# Patient Record
Sex: Female | Born: 1955
Health system: Southern US, Community
[De-identification: ages and names within clinical notes are randomized; demographics above are authoritative.]

## PROBLEM LIST (undated history)

## (undated) DIAGNOSIS — M545 Low back pain, unspecified: Secondary | ICD-10-CM

## (undated) DIAGNOSIS — C4491 Basal cell carcinoma of skin, unspecified: Secondary | ICD-10-CM

## (undated) DIAGNOSIS — I10 Essential (primary) hypertension: Secondary | ICD-10-CM

## (undated) DIAGNOSIS — E785 Hyperlipidemia, unspecified: Secondary | ICD-10-CM

## (undated) HISTORY — PX: TONSILLECTOMY: SUR1361

## (undated) HISTORY — DX: Low back pain: M54.5

## (undated) HISTORY — DX: Basal cell carcinoma of skin, unspecified: C44.91

## (undated) HISTORY — DX: Low back pain, unspecified: M54.50

## (undated) HISTORY — DX: Hyperlipidemia, unspecified: E78.5

---

## 1998-12-08 ENCOUNTER — Other Ambulatory Visit: Admission: RE | Admit: 1998-12-08 | Discharge: 1998-12-08 | Payer: Self-pay | Admitting: *Deleted

## 2008-08-22 HISTORY — PX: COLONOSCOPY: SHX174

## 2008-11-18 ENCOUNTER — Ambulatory Visit: Payer: Self-pay | Admitting: Family Medicine

## 2008-11-18 DIAGNOSIS — I10 Essential (primary) hypertension: Secondary | ICD-10-CM | POA: Insufficient documentation

## 2008-11-18 DIAGNOSIS — R143 Flatulence: Secondary | ICD-10-CM

## 2008-11-18 DIAGNOSIS — R141 Gas pain: Secondary | ICD-10-CM | POA: Insufficient documentation

## 2008-11-18 DIAGNOSIS — R142 Eructation: Secondary | ICD-10-CM

## 2008-11-18 HISTORY — DX: Gas pain: R14.1

## 2008-11-18 HISTORY — DX: Gas pain: R14.3

## 2009-01-20 ENCOUNTER — Emergency Department (HOSPITAL_COMMUNITY): Admission: EM | Admit: 2009-01-20 | Discharge: 2009-01-20 | Payer: Self-pay | Admitting: Family Medicine

## 2009-04-08 ENCOUNTER — Ambulatory Visit: Payer: Self-pay | Admitting: Family Medicine

## 2009-04-08 LAB — CONVERTED CEMR LAB
ALT: 13 units/L (ref 0–35)
AST: 16 units/L (ref 0–37)
Alkaline Phosphatase: 62 units/L (ref 39–117)
Basophils Absolute: 0 10*3/uL (ref 0.0–0.1)
Basophils Relative: 0.4 % (ref 0.0–3.0)
Bilirubin, Direct: 0.1 mg/dL (ref 0.0–0.3)
CO2: 31 meq/L (ref 19–32)
Chloride: 109 meq/L (ref 96–112)
Creatinine, Ser: 0.7 mg/dL (ref 0.4–1.2)
Eosinophils Absolute: 0.1 10*3/uL (ref 0.0–0.7)
LDL Cholesterol: 140 mg/dL — ABNORMAL HIGH (ref 0–99)
MCHC: 33.8 g/dL (ref 30.0–36.0)
MCV: 92.7 fL (ref 78.0–100.0)
Monocytes Absolute: 0.5 10*3/uL (ref 0.1–1.0)
Neutro Abs: 4.5 10*3/uL (ref 1.4–7.7)
Neutrophils Relative %: 66.6 % (ref 43.0–77.0)
Potassium: 4.5 meq/L (ref 3.5–5.1)
RBC: 4.1 M/uL (ref 3.87–5.11)
RDW: 12.1 % (ref 11.5–14.6)
Sodium: 144 meq/L (ref 135–145)
Total Bilirubin: 0.7 mg/dL (ref 0.3–1.2)
Total CHOL/HDL Ratio: 4
Total Protein: 7 g/dL (ref 6.0–8.3)

## 2009-04-15 ENCOUNTER — Other Ambulatory Visit: Admission: RE | Admit: 2009-04-15 | Discharge: 2009-04-15 | Payer: Self-pay | Admitting: Family Medicine

## 2009-04-15 ENCOUNTER — Encounter: Payer: Self-pay | Admitting: Family Medicine

## 2009-04-15 ENCOUNTER — Ambulatory Visit: Payer: Self-pay | Admitting: Family Medicine

## 2009-04-15 DIAGNOSIS — E7800 Pure hypercholesterolemia, unspecified: Secondary | ICD-10-CM | POA: Insufficient documentation

## 2009-04-22 ENCOUNTER — Encounter (INDEPENDENT_AMBULATORY_CARE_PROVIDER_SITE_OTHER): Payer: Self-pay | Admitting: *Deleted

## 2009-05-08 ENCOUNTER — Ambulatory Visit: Payer: Self-pay | Admitting: Gastroenterology

## 2009-05-19 ENCOUNTER — Encounter: Admission: RE | Admit: 2009-05-19 | Discharge: 2009-05-19 | Payer: Self-pay | Admitting: Family Medicine

## 2009-05-20 ENCOUNTER — Encounter (INDEPENDENT_AMBULATORY_CARE_PROVIDER_SITE_OTHER): Payer: Self-pay | Admitting: *Deleted

## 2009-05-22 ENCOUNTER — Encounter: Payer: Self-pay | Admitting: Gastroenterology

## 2009-05-22 ENCOUNTER — Ambulatory Visit: Payer: Self-pay | Admitting: Gastroenterology

## 2009-05-26 ENCOUNTER — Encounter: Payer: Self-pay | Admitting: Gastroenterology

## 2009-07-21 ENCOUNTER — Ambulatory Visit: Payer: Self-pay | Admitting: Family Medicine

## 2009-07-22 LAB — CONVERTED CEMR LAB
AST: 22 units/L (ref 0–37)
Cholesterol: 200 mg/dL (ref 0–200)
LDL Cholesterol: 130 mg/dL — ABNORMAL HIGH (ref 0–99)
Total CHOL/HDL Ratio: 4

## 2009-09-18 ENCOUNTER — Emergency Department (HOSPITAL_COMMUNITY): Admission: EM | Admit: 2009-09-18 | Discharge: 2009-09-18 | Payer: Self-pay | Admitting: Family Medicine

## 2010-03-16 ENCOUNTER — Emergency Department (HOSPITAL_COMMUNITY): Admission: EM | Admit: 2010-03-16 | Discharge: 2010-03-16 | Payer: Self-pay | Admitting: Family Medicine

## 2010-11-06 LAB — POCT URINALYSIS DIP (DEVICE)
Bilirubin Urine: NEGATIVE
Glucose, UA: NEGATIVE mg/dL
Nitrite: NEGATIVE
Urobilinogen, UA: 0.2 mg/dL (ref 0.0–1.0)

## 2011-06-20 ENCOUNTER — Other Ambulatory Visit: Payer: Self-pay | Admitting: Family Medicine

## 2011-06-20 NOTE — Telephone Encounter (Signed)
Numbers we have do not work so i notified pharmacy to let her know

## 2011-06-20 NOTE — Telephone Encounter (Signed)
Overdue for CPX .. last seen 2010.Marland Kitchen Needs appt okay to refill till then if cshe has been on consistently prior to this. If she has been off for a while get her in soon before refiulling.

## 2011-07-24 ENCOUNTER — Emergency Department (HOSPITAL_COMMUNITY): Payer: Self-pay

## 2011-07-24 ENCOUNTER — Emergency Department (HOSPITAL_COMMUNITY)
Admission: EM | Admit: 2011-07-24 | Discharge: 2011-07-24 | Disposition: A | Discharge status: Home / Self Care | Payer: Self-pay | Attending: Emergency Medicine | Admitting: Emergency Medicine

## 2011-07-24 ENCOUNTER — Encounter: Payer: Self-pay | Admitting: *Deleted

## 2011-07-24 DIAGNOSIS — Y9239 Other specified sports and athletic area as the place of occurrence of the external cause: Secondary | ICD-10-CM | POA: Insufficient documentation

## 2011-07-24 DIAGNOSIS — I10 Essential (primary) hypertension: Secondary | ICD-10-CM | POA: Insufficient documentation

## 2011-07-24 DIAGNOSIS — R296 Repeated falls: Secondary | ICD-10-CM | POA: Insufficient documentation

## 2011-07-24 DIAGNOSIS — S52131A Displaced fracture of neck of right radius, initial encounter for closed fracture: Secondary | ICD-10-CM

## 2011-07-24 DIAGNOSIS — S52133A Displaced fracture of neck of unspecified radius, initial encounter for closed fracture: Secondary | ICD-10-CM | POA: Insufficient documentation

## 2011-07-24 HISTORY — DX: Essential (primary) hypertension: I10

## 2011-07-24 MED ORDER — HYDROCODONE-ACETAMINOPHEN 5-325 MG PO TABS: ORAL_TABLET | ORAL | Status: DC

## 2011-07-24 MED ORDER — HYDROCODONE-ACETAMINOPHEN 5-325 MG PO TABS
1.0000 | ORAL_TABLET | Freq: Once | ORAL | Status: AC
  Administered 2011-07-24: 1 via ORAL
  Filled 2011-07-24: qty 1

## 2011-07-24 NOTE — ED Provider Notes (Signed)
History     CSN: [id number] Arrival date & time: 07/24/2011  6:41 PM   First MD Initiated Contact with Patient 07/24/11 1805      Chief Complaint  Patient presents with  . Arm Injury    right     (Consider location/radiation/quality/duration/timing/severity/associated sxs/prior treatment) Patient is a 55 y.o. female presenting with arm injury. The history is provided by the patient. No language interpreter was used.  Arm Injury  The incident occurred just prior to arrival. The incident occurred at home. The injury mechanism was a fall. Context: pt was walking down a hill and caught her weight on outstretched arm.  c/o R elbow pain. The wounds were not self-inflicted. She came to the ER via personal transport. The pain is moderate. There have been no prior injuries to these areas. She is right-handed. She has been behaving normally. She has received no recent medical care.    Past Medical History  Diagnosis Date  . Hypertension     History reviewed. No pertinent past surgical history.  History reviewed. No pertinent family history.  History  Substance Use Topics  . Smoking status: Never Smoker   . Smokeless tobacco: Not on file  . Alcohol Use: No    OB History    Grav Para Term Preterm Abortions TAB SAB Ect Mult Living                  Review of Systems  Musculoskeletal:       Trauma  All other systems reviewed and are negative.    Allergies  Review of patient's allergies indicates no known allergies.  Home Medications   Current Outpatient Rx  Name Route Sig Dispense Refill  . AMLODIPINE BESYLATE 5 MG PO TABS Oral Take 5 mg by mouth daily.      . IBUPROFEN 200 MG PO TABS Oral Take 200 mg by mouth as needed. For pain     . LISINOPRIL 40 MG PO TABS Oral Take 40 mg by mouth daily.        BP 142/88  Pulse 79  Temp(Src) 98.3 F (36.8 C) (Oral)  Resp 18  Ht 5\' 3"  (1.6 m)  Wt 159 lb (72.122 kg)  BMI 28.17 kg/m2  SpO2 100%  Physical Exam  Nursing note  and vitals reviewed. Constitutional: She is oriented to person, place, and time. She appears well-developed and well-nourished. No distress.  HENT:  Head: Normocephalic and atraumatic.  Eyes: EOM are normal.  Neck: Normal range of motion.  Cardiovascular: Normal rate, regular rhythm and normal heart sounds.   Pulmonary/Chest: Effort normal and breath sounds normal.  Abdominal: Soft. She exhibits no distension. There is no tenderness.  Musculoskeletal: She exhibits tenderness.       Right elbow: She exhibits decreased range of motion, swelling and effusion. She exhibits no deformity and no laceration. tenderness found. Radial head tenderness noted.       Arms: Neurological: She is alert and oriented to person, place, and time.  Skin: Skin is warm and dry.  Psychiatric: She has a normal mood and affect. Judgment normal.    ED Course  Procedures (including critical care time)  Labs Reviewed - No data to display Dg Elbow Complete Right  07/24/2011  *RADIOLOGY REPORT*  Clinical Data: Fall, right elbow pain.  RIGHT ELBOW - COMPLETE 3+ VIEW  Comparison: None  Findings: There is a right radial neck fracture.  This is a subtle, seen best on the oblique view.  Associated large  joint effusion. No additional acute bony abnormality.  IMPRESSION: Subtle right radial neck fracture with associated joint effusion.  Original Report Authenticated By: Cyndie Chime, M.D.   Dg Forearm Right  07/24/2011  *RADIOLOGY REPORT*  Clinical Data: Fall, pain.  RIGHT FOREARM - 2 VIEW  Comparison: None  Findings: No acute bony abnormality within the forearm or wrist. The elbow is not as well visualized on this forearm series.  Please see elbow report.  IMPRESSION: No acute forearm abnormality.  Original Report Authenticated By: Cyndie Chime, M.D.     No diagnosis found.    MDM   8:22 PM i spoke with dr. Hilda Lias.  He is coming to see the pt.       Worthy Rancher, PA 07/24/11 2015  Worthy Rancher,  PA 07/24/11 [phone]

## 2011-07-24 NOTE — Consult Note (Signed)
Reason for Consult:radial head fracture right  Referring Physician: ER    Valerie Parker is an 55 y.o. female.  HPI: [place] grandkids.  She fell on out stretched hand and hurt her right elbow.  Xrays show a nondisplaced fracture of the radial head on the right.  She has no other injury.  Past Medical History  Diagnosis Date  . Hypertension     History reviewed. No pertinent past surgical history.  History reviewed. No pertinent family history.  Social History:  reports that she has never smoked. She does not have any smokeless tobacco history on file. She reports that she does not drink alcohol or use illicit drugs.  Allergies: No Known Allergies  Medications: I have reviewed the patient's current medications.  No results found for this or any previous visit (from the past 48 hour(s)).  Dg Elbow Complete Right  07/24/2011  *RADIOLOGY REPORT*  Clinical Data: Fall, right elbow pain.  RIGHT ELBOW - COMPLETE 3+ VIEW  Comparison: None  Findings: There is a right radial neck fracture.  This is a subtle, seen best on the oblique view.  Associated large joint effusion. No additional acute bony abnormality.  IMPRESSION: Subtle right radial neck fracture with associated joint effusion.  Original Report Authenticated By: Cyndie Chime, M.D.   Dg Forearm Right  07/24/2011  *RADIOLOGY REPORT*  Clinical Data: Fall, pain.  RIGHT FOREARM - 2 VIEW  Comparison: None  Findings: No acute bony abnormality within the forearm or wrist. The elbow is not as well visualized on this forearm series.  Please see elbow report.  IMPRESSION: No acute forearm abnormality.  Original Report Authenticated By: Cyndie Chime, M.D.    Review of Systems  Constitutional: Negative.   HENT: Negative.   Respiratory: Negative.   Cardiovascular: Negative.   Gastrointestinal: Negative.   Genitourinary: Negative.   Musculoskeletal: Positive for joint pain (right elbow pain today after fall.   Pain to supinate.) and falls.  Skin: Negative.   Neurological: Negative.   Endo/Heme/Allergies: Negative.   Psychiatric/Behavioral: Negative.    Blood pressure 142/88, pulse 79, temperature 98.3 F (36.8 C), temperature source Oral, resp. rate 18, height 5\' 3"  (1.6 m), weight 72.122 kg (159 lb), SpO2 100.00%. Physical Exam  Constitutional: She is oriented to person, place, and time. She appears well-developed and well-nourished.  HENT:  Head: Normocephalic and atraumatic.  Eyes: Conjunctivae and EOM are normal. Pupils are equal, round, and reactive to light.  Neck: Normal range of motion. Neck supple.  Cardiovascular: Regular rhythm and intact distal pulses.   Respiratory: Effort normal and breath sounds normal.  GI: Soft. Bowel sounds are normal.  Musculoskeletal: She exhibits tenderness (with motion of the right elbow, pain with supination/pronation.  No redness or swelling.).       Right elbow: She exhibits decreased range of motion.       Arms: Neurological: She is alert and oriented to person, place, and time. She has normal reflexes.  Skin: Skin is warm.  Psychiatric: She has a normal mood and affect. Her behavior is normal. Judgment and thought content normal.    Assessment/Plan: Nondisplaced fracture of the right radial head.  She will be placed in a posterior splint.  I will see her in ten days in the office and remove the splint.    Precautions discussed.  Larson Limones 07/24/2011, 8:28 PM

## 2011-07-24 NOTE — ED Notes (Signed)
Pt c/o pain to right arm after falling today

## 2011-07-24 NOTE — ED Notes (Signed)
Pt a/ox4. resp even and unlabored. NAD at this time. D/c Instructions and rx x1 reviewed with pt. Pt verbalized understanding. Pt ambulated to lobby with steady gate.

## 2011-07-24 NOTE — ED Notes (Signed)
Posterior splint applied. Sling applied.

## 2011-07-25 NOTE — ED Provider Notes (Signed)
Medical screening examination/treatment/procedure(s) were performed by non-physician practitioner and as supervising physician I was immediately available for consultation/collaboration.   Joya Gaskins, MD 07/25/11 1320

## 2011-08-02 ENCOUNTER — Other Ambulatory Visit (HOSPITAL_COMMUNITY): Payer: Self-pay | Admitting: Orthopaedic Surgery

## 2011-08-02 ENCOUNTER — Ambulatory Visit (HOSPITAL_COMMUNITY)
Admission: RE | Admit: 2011-08-02 | Discharge: 2011-08-02 | Disposition: A | Discharge status: Home / Self Care | Payer: Self-pay | Source: Ambulatory Visit | Attending: Orthopaedic Surgery | Admitting: Orthopaedic Surgery

## 2011-08-02 DIAGNOSIS — S52123A Displaced fracture of head of unspecified radius, initial encounter for closed fracture: Secondary | ICD-10-CM

## 2011-08-02 DIAGNOSIS — Z4789 Encounter for other orthopedic aftercare: Secondary | ICD-10-CM | POA: Insufficient documentation

## 2011-08-25 ENCOUNTER — Ambulatory Visit (HOSPITAL_COMMUNITY)
Admission: RE | Admit: 2011-08-25 | Discharge: 2011-08-25 | Disposition: A | Discharge status: Home / Self Care | Payer: 59 | Source: Ambulatory Visit | Attending: Orthopaedic Surgery | Admitting: Orthopaedic Surgery

## 2011-08-25 ENCOUNTER — Other Ambulatory Visit (HOSPITAL_COMMUNITY): Payer: Self-pay | Admitting: Orthopaedic Surgery

## 2011-08-25 DIAGNOSIS — T148XXA Other injury of unspecified body region, initial encounter: Secondary | ICD-10-CM

## 2011-08-25 DIAGNOSIS — IMO0001 Reserved for inherently not codable concepts without codable children: Secondary | ICD-10-CM | POA: Insufficient documentation

## 2012-06-06 ENCOUNTER — Other Ambulatory Visit: Payer: Self-pay | Admitting: Family Medicine

## 2012-06-06 DIAGNOSIS — Z139 Encounter for screening, unspecified: Secondary | ICD-10-CM

## 2012-06-14 ENCOUNTER — Ambulatory Visit (HOSPITAL_COMMUNITY)
Admission: RE | Admit: 2012-06-14 | Discharge: 2012-06-14 | Disposition: A | Discharge status: Home / Self Care | Payer: 59 | Source: Ambulatory Visit | Attending: Family Medicine | Admitting: Family Medicine

## 2012-06-14 DIAGNOSIS — Z139 Encounter for screening, unspecified: Secondary | ICD-10-CM

## 2012-06-14 DIAGNOSIS — Z1231 Encounter for screening mammogram for malignant neoplasm of breast: Secondary | ICD-10-CM | POA: Insufficient documentation

## 2012-11-13 ENCOUNTER — Ambulatory Visit (INDEPENDENT_AMBULATORY_CARE_PROVIDER_SITE_OTHER): Payer: 59 | Admitting: Family Medicine

## 2012-11-13 ENCOUNTER — Encounter: Payer: Self-pay | Admitting: Family Medicine

## 2012-11-13 VITALS — BP 146/94 | Temp 98.3°F | Ht 62.0 in | Wt 156.4 lb

## 2012-11-13 DIAGNOSIS — B351 Tinea unguium: Secondary | ICD-10-CM

## 2012-11-13 DIAGNOSIS — I10 Essential (primary) hypertension: Secondary | ICD-10-CM

## 2012-11-13 MED ORDER — LISINOPRIL 40 MG PO TABS: 40.0000 mg | ORAL_TABLET | Freq: Every day | ORAL | Status: DC

## 2012-11-13 MED ORDER — AMLODIPINE BESYLATE 5 MG PO TABS: 5.0000 mg | ORAL_TABLET | Freq: Every day | ORAL | Status: DC

## 2012-11-13 MED ORDER — CEFPROZIL 500 MG PO TABS: 500.0000 mg | ORAL_TABLET | Freq: Two times a day (BID) | ORAL | Status: DC

## 2012-11-13 NOTE — Patient Instructions (Signed)
We will work on podiatry referral

## 2012-11-14 NOTE — Progress Notes (Signed)
  Subjective:    Patient ID: Valerie Parker, female    DOB: 1956-03-16, 57 y.o.   MRN: [id number]  HPI patient presents the office complaining of toe pain. Off-and-on for the past year. Has had thickening of the toenails. In the last week has been much more tender. Possibly even some discharge. No fever. Claims compliance with her blood pressure medication. No headache or chest pain.    Review of Systems ROS otherwise negative.     Objective:   Physical Exam  Vital signs stable blood pressure good on repeat HEENT normal. Lungs clear. Heart regular in rhythm. Feet dystrophic toes with significant onychomycosis      Assessment & Plan:  Impression onychomycosis with possible secondary cellulitis. Discussed. #2 hypertension good control. Plan Cefzil twice a day 10 days. Podiatry consult. Maintain same blood pressure meds. Recheck in 6 months. WSL

## 2012-11-19 ENCOUNTER — Telehealth: Payer: Self-pay | Admitting: Family Medicine

## 2012-11-19 NOTE — Telephone Encounter (Signed)
Faxed demo, ins crd, OV note to Dr. Pricilla Holm @ [phone] for appt on 12/20/12 @ 8:45, pt's aware

## 2013-05-18 ENCOUNTER — Encounter: Payer: Self-pay | Admitting: *Deleted

## 2013-05-20 ENCOUNTER — Ambulatory Visit: Payer: 59 | Admitting: Family Medicine

## 2013-05-24 ENCOUNTER — Encounter: Payer: Self-pay | Admitting: Family Medicine

## 2013-05-24 ENCOUNTER — Ambulatory Visit (INDEPENDENT_AMBULATORY_CARE_PROVIDER_SITE_OTHER): Payer: 59 | Admitting: Family Medicine

## 2013-05-24 VITALS — BP 128/90 | Temp 97.8°F | Ht 63.5 in | Wt 158.0 lb

## 2013-05-24 DIAGNOSIS — E7800 Pure hypercholesterolemia, unspecified: Secondary | ICD-10-CM

## 2013-05-24 DIAGNOSIS — C4491 Basal cell carcinoma of skin, unspecified: Secondary | ICD-10-CM

## 2013-05-24 DIAGNOSIS — E785 Hyperlipidemia, unspecified: Secondary | ICD-10-CM

## 2013-05-24 DIAGNOSIS — Z79899 Other long term (current) drug therapy: Secondary | ICD-10-CM

## 2013-05-24 DIAGNOSIS — I10 Essential (primary) hypertension: Secondary | ICD-10-CM

## 2013-05-24 MED ORDER — AMLODIPINE BESYLATE 5 MG PO TABS: 5.0000 mg | ORAL_TABLET | Freq: Every day | ORAL | Status: DC

## 2013-05-24 MED ORDER — LISINOPRIL 40 MG PO TABS: 40.0000 mg | ORAL_TABLET | Freq: Every day | ORAL | Status: DC

## 2013-05-24 NOTE — Progress Notes (Signed)
  Subjective:    Patient ID: Valerie Parker, female    DOB: 01-Oct-1955, 57 y.o.   MRN: [id number]  HPIscab on forehead. Not painful. Patient scratched off scab and brought it in today. Patient has history of skin cancer on her chin. This was basal cell skin cancer. Check moles on left arm.   Patient claims compliance with blood pressure medicine. Blood pressure decent elsewhere. Tried watch her salt intake. Exercise not much these days.  History of hyperlipidemia. Patient claims that she is noncompliant with medications.  Review of Systems No chest pain no shortness breath no abdominal pain ROS woman    Objective:   Physical Exam  Alert no acute distress. Lungs clear. Heart regular in rhythm. H&T for head hypertrophic small lesion with central talented ectasia and pearly edges. Arm seborrheic keratoses diffuse. Blood pressure decent on repeat.      Assessment & Plan:  Impression 1 hypertension good control. #2 hyperlipidemia status uncertain. #3 seborrheic keratosis of the arms reassured #4 probable basal cell skin cancer for head discussed plan term consult. Appropriate blood work. Meds refilled. Diet exercise discussed. WSL

## 2013-06-07 LAB — HEPATIC FUNCTION PANEL
ALT: 21 U/L (ref 0–35)
AST: 15 U/L (ref 0–37)
Alkaline Phosphatase: 69 U/L (ref 39–117)
Indirect Bilirubin: 0.5 mg/dL (ref 0.0–0.9)
Total Protein: 7 g/dL (ref 6.0–8.3)

## 2013-06-07 LAB — BASIC METABOLIC PANEL
BUN: 15 mg/dL (ref 6–23)
Calcium: 9.3 mg/dL (ref 8.4–10.5)
Creat: 0.71 mg/dL (ref 0.50–1.10)

## 2013-06-07 LAB — LIPID PANEL
Cholesterol: 191 mg/dL (ref 0–200)
Triglycerides: 88 mg/dL (ref <150)

## 2013-06-17 ENCOUNTER — Encounter: Payer: Self-pay | Admitting: Family Medicine

## 2013-09-05 ENCOUNTER — Ambulatory Visit (INDEPENDENT_AMBULATORY_CARE_PROVIDER_SITE_OTHER): Payer: 59 | Admitting: Nurse Practitioner

## 2013-09-05 ENCOUNTER — Encounter: Payer: Self-pay | Admitting: Nurse Practitioner

## 2013-09-05 VITALS — BP 128/88 | Temp 98.7°F | Ht 63.5 in | Wt 161.0 lb

## 2013-09-05 DIAGNOSIS — Z1239 Encounter for other screening for malignant neoplasm of breast: Secondary | ICD-10-CM

## 2013-09-05 DIAGNOSIS — N390 Urinary tract infection, site not specified: Secondary | ICD-10-CM

## 2013-09-05 DIAGNOSIS — R300 Dysuria: Secondary | ICD-10-CM

## 2013-09-05 LAB — POCT URINALYSIS DIPSTICK
PH UA: 5
RBC UA: 250
SPEC GRAV UA: 1.025
UROBILINOGEN UA: 2

## 2013-09-05 MED ORDER — CIPROFLOXACIN HCL 500 MG PO TABS: 500.0000 mg | ORAL_TABLET | Freq: Two times a day (BID) | ORAL | Status: DC

## 2013-09-05 NOTE — Patient Instructions (Signed)
AZO standard as directed for 48 hours then stop 

## 2013-09-06 ENCOUNTER — Encounter: Payer: Self-pay | Admitting: Nurse Practitioner

## 2013-09-06 LAB — POCT UA - MICROSCOPIC ONLY: BACTERIA, U MICROSCOPIC: POSITIVE

## 2013-09-06 NOTE — Progress Notes (Signed)
Subjective:  Presents complaints of urinary symptoms over the past week. Dysuria with urgency and frequency. No history of recent UTI. No fever or chills. No nausea vomiting. No pelvic pain. No back or flank pain. No vaginal discharge. Same sexual partner. Taking fluids well. Patient requesting mammogram and information on colonoscopy. Is also due for her preventive health physical. Her insurance will run out at the end of January, it may be months before she will have insurance again.  Objective:   BP 128/88  Temp(Src) 98.7 F (37.1 C) (Oral)  Ht 5' 3.5" (1.613 m)  Wt 161 lb (73.029 kg)  BMI 28.07 kg/m2 NAD. Alert, oriented. Lungs clear. No CVA or flank tenderness. Heart regular rhythm. Abdomen soft nondistended with minimal suprapubic area discomfort. Urine microscopic WBC TNTC; RBCs TNTC with bacteria noted.  Assessment:Infection of urinary tract  Dysuria - Plan: POCT UA - Microscopic Only, POCT urinalysis dipstick, MM Digital Screening  Screening for breast cancer - Plan: MM Digital Screening  Plan: Meds ordered this encounter  Medications  . ciprofloxacin (CIPRO) 500 MG tablet    Sig: Take 1 tablet (500 mg total) by mouth 2 (two) times daily.    Dispense:  14 tablet    Refill:  0    Order Specific Question:  Supervising Provider    Answer:  Maggie Font   May use AZO for the next 48 hours as directed then DC. Warning signs reviewed. Call back in 4 days if no improvement, call or go to ER sooner if worse. Mammogram scheduled. Given information on colonoscopy see the patient can schedule her appointment. Strongly recommend preventive health physical within the next few weeks.

## 2013-09-09 ENCOUNTER — Ambulatory Visit (HOSPITAL_COMMUNITY)
Admission: RE | Admit: 2013-09-09 | Discharge: 2013-09-09 | Disposition: A | Discharge status: Home / Self Care | Payer: 59 | Source: Ambulatory Visit | Attending: Nurse Practitioner | Admitting: Nurse Practitioner

## 2013-09-09 DIAGNOSIS — R300 Dysuria: Secondary | ICD-10-CM

## 2013-09-09 DIAGNOSIS — Z1239 Encounter for other screening for malignant neoplasm of breast: Secondary | ICD-10-CM

## 2013-09-09 DIAGNOSIS — Z1231 Encounter for screening mammogram for malignant neoplasm of breast: Secondary | ICD-10-CM | POA: Insufficient documentation

## 2013-09-12 ENCOUNTER — Encounter: Payer: Self-pay | Admitting: Nurse Practitioner

## 2013-09-12 ENCOUNTER — Ambulatory Visit (INDEPENDENT_AMBULATORY_CARE_PROVIDER_SITE_OTHER): Payer: 59 | Admitting: Nurse Practitioner

## 2013-09-12 VITALS — BP 128/80 | Ht 63.5 in | Wt 162.0 lb

## 2013-09-12 DIAGNOSIS — Z0000 Encounter for general adult medical examination without abnormal findings: Secondary | ICD-10-CM

## 2013-09-12 DIAGNOSIS — Z01419 Encounter for gynecological examination (general) (routine) without abnormal findings: Secondary | ICD-10-CM

## 2013-09-17 ENCOUNTER — Encounter: Payer: Self-pay | Admitting: Nurse Practitioner

## 2013-09-17 NOTE — Progress Notes (Signed)
   Subjective:    Patient ID: Valerie Parker, female    DOB: 02/26/1956, 58 y.o.   MRN: [id number]  HPI Presents for wellness checkup. Same sexual partner. No vaginal bleeding or pelvic pain. Has appt for visual exam 1/28. Regular dental care. Seen recently for UTI. Symptoms have resolved. Unsure about date for colonoscopy was told 3 years but when she called GI office they said 5 years which will put her due this October. Her insurance will be ending at the end of the month.    Review of Systems  Constitutional: Negative for activity change, appetite change and fatigue.  HENT: Negative for dental problem, ear pain, sinus pressure and sore throat.   Respiratory: Negative for cough, chest tightness, shortness of breath and wheezing.   Cardiovascular: Negative for chest pain and leg swelling.  Gastrointestinal: Negative for nausea, vomiting, abdominal pain, diarrhea, constipation and blood in stool.  Genitourinary: Negative for dysuria, urgency, frequency, vaginal bleeding, vaginal discharge, enuresis, difficulty urinating, genital sores and pelvic pain.       Objective:   Physical Exam  Vitals reviewed. Constitutional: She is oriented to person, place, and time. She appears well-developed. No distress.  HENT:  Right Ear: External ear normal.  Left Ear: External ear normal.  Mouth/Throat: Oropharynx is clear and moist.  Neck: Normal range of motion. Neck supple. No tracheal deviation present. No thyromegaly present.  Cardiovascular: Normal rate, regular rhythm and normal heart sounds.  Exam reveals no gallop.   No murmur heard. Pulmonary/Chest: Effort normal and breath sounds normal.  Abdominal: Soft. She exhibits no distension. There is no tenderness.  Genitourinary: Vagina normal and uterus normal. No vaginal discharge found.  Musculoskeletal: She exhibits no edema.  Lymphadenopathy:    She has no cervical adenopathy.  Neurological: She is alert and oriented to person, place, and time.   Skin: Skin is warm and dry. No rash noted.  Psychiatric: She has a normal mood and affect. Her behavior is normal.   Breast exam: No masses noted, axillae no adenopathy. External GU normal. Vagina no discharge. Bimanual exam normal, no obvious masses, nontender. See labs 06/07/2013        Assessment & Plan:  Well woman exam  Our referral coordinator spoke with GI specialist. They're willing to repeat colonoscopy but patient's insurance will not cover until October. Patient notified. Recommend healthy diet, regular activity, daily calcium and vitamin D supplementation. Next physical in one year.

## 2014-04-10 ENCOUNTER — Encounter: Payer: Self-pay | Admitting: Gastroenterology

## 2014-05-19 ENCOUNTER — Telehealth: Payer: Self-pay | Admitting: Nurse Practitioner

## 2014-05-19 NOTE — Telephone Encounter (Signed)
Discussed with patient

## 2014-05-19 NOTE — Telephone Encounter (Signed)
Patient wants to know if the last BW that she had with our office showed any signs of Hep C or was that ruled out?

## 2014-08-26 ENCOUNTER — Ambulatory Visit: Payer: 59 | Admitting: Family Medicine

## 2014-09-05 ENCOUNTER — Ambulatory Visit (INDEPENDENT_AMBULATORY_CARE_PROVIDER_SITE_OTHER): Payer: 59 | Admitting: Family Medicine

## 2014-09-05 ENCOUNTER — Encounter: Payer: Self-pay | Admitting: Family Medicine

## 2014-09-05 VITALS — BP 130/90 | Ht 63.5 in | Wt 154.0 lb

## 2014-09-05 DIAGNOSIS — R079 Chest pain, unspecified: Secondary | ICD-10-CM

## 2014-09-05 DIAGNOSIS — E785 Hyperlipidemia, unspecified: Secondary | ICD-10-CM

## 2014-09-05 DIAGNOSIS — I10 Essential (primary) hypertension: Secondary | ICD-10-CM

## 2014-09-05 DIAGNOSIS — R072 Precordial pain: Secondary | ICD-10-CM

## 2014-09-05 DIAGNOSIS — Z1159 Encounter for screening for other viral diseases: Secondary | ICD-10-CM

## 2014-09-05 DIAGNOSIS — Z79899 Other long term (current) drug therapy: Secondary | ICD-10-CM

## 2014-09-05 MED ORDER — LISINOPRIL 40 MG PO TABS: 40.0000 mg | ORAL_TABLET | Freq: Every day | ORAL | Status: DC

## 2014-09-05 MED ORDER — AMLODIPINE BESYLATE 5 MG PO TABS: 5.0000 mg | ORAL_TABLET | Freq: Every day | ORAL | Status: DC

## 2014-09-05 MED ORDER — RANITIDINE HCL 300 MG PO TABS: 300.0000 mg | ORAL_TABLET | Freq: Every day | ORAL | Status: DC

## 2014-09-05 NOTE — Progress Notes (Signed)
   Subjective:    Patient ID: Valerie Parker, female    DOB: 1955/10/10, 59 y.o.   MRN: [id number] Of note patient arrives for "a wellness visit". On further history she has a tremendous number of issues in need of acute attention. Wellness will be delayed. To address these.   HPI The patient comes in today for a wellness visit.  A review of their health history was completed.  A review of medications was also completed.  Any needed refills: Yes  Eating habits: Health conscious  Falls/  MVA accidents in past few months: No  Regular exercise: Working, walks dog, plays with grandchildren  Specialist pt sees on regular basis: No  Preventative health issues were discussed.   Additional concerns: Mole on back, painful (2 months) Low back pain (4 months) Indigestion, N/V, diarrhea, chest pressure ,center in the past week.  Milk intolerance  Mole new concer, looks different  Itches at times, sensitivr to pressure  Low back uncomfortable at times pulling sens, feels achey and tight. Several months ago hurt a fair amnt.sig difficulty with motion. Took otc meds with work, and notes. Back has improved still pulls at times  Does not want pelvic exam. Would like breast exam and pelvic with [facility] Sauer.   Would like to be tested for Hep C. Siblings have it. Has a sister with it.three siblings f  Exercise walking four d per wk,  Full time hours nearly  Chest discomfort off and on for six months.. Chest discomfort last for 1 total minutes slight pressure times. Walks daily almost and never has discomfort with walking. Pain is primarily in the evening time. Does appear to be worse after certain meals. Next  History of hypertension claims compliance with medication.  Nothing major, usually more at rest, not walking,    Review of Systems No abdominal pain no change in bowel habits no blood in stool no weight loss no weight gain    Objective:   Physical Exam Alert vital stable blood pressure  good on repeat HEENT normal lungs clear heart regular in rhythm. Seborrheic keratosis noted a left posterior shoulder. Abdomen benign. Ankles without edema.   EKG normal sinus rhythm no significant ST-T changes       Assessment & Plan:  Impression 1 hypertension good control #2 hepatitis C exposure curiously has 3 siblings with it. #3 seborrheic keratosis discussed and reassured #4 chest pain very atypical and never with exertion will hold off on further workup for now rationale discussed #5 potential reflux plan appropriate blood work. Hepatitis C. Follow-up with [facility] Sauer for preventive physical. Trial of ranitidine. Blood pressure medications maintain. Diet exercise discussed. WSL

## 2014-09-15 LAB — LIPID PANEL
Cholesterol: 188 mg/dL (ref 0–200)
HDL: 50 mg/dL (ref >39)
LDL CALC: 125 mg/dL — AB (ref 0–99)
Total CHOL/HDL Ratio: 3.8 Ratio
Triglycerides: 64 mg/dL (ref <150)
VLDL: 13 mg/dL (ref 0–40)

## 2014-09-15 LAB — HEPATITIS C ANTIBODY: HCV AB: NEGATIVE

## 2014-09-15 LAB — HEPATIC FUNCTION PANEL
ALT: 20 U/L (ref 0–35)
AST: 15 U/L (ref 0–37)
Albumin: 4 g/dL (ref 3.5–5.2)
Alkaline Phosphatase: 70 U/L (ref 39–117)
BILIRUBIN DIRECT: 0.1 mg/dL (ref 0.0–0.3)
BILIRUBIN INDIRECT: 0.5 mg/dL (ref 0.2–1.2)
Total Bilirubin: 0.6 mg/dL (ref 0.2–1.2)
Total Protein: 6.8 g/dL (ref 6.0–8.3)

## 2014-09-15 LAB — BASIC METABOLIC PANEL
BUN: 14 mg/dL (ref 6–23)
CHLORIDE: 107 meq/L (ref 96–112)
CO2: 28 meq/L (ref 19–32)
Calcium: 8.9 mg/dL (ref 8.4–10.5)
Creat: 0.74 mg/dL (ref 0.50–1.10)
Glucose, Bld: 106 mg/dL — ABNORMAL HIGH (ref 70–99)
Potassium: 4 mEq/L (ref 3.5–5.3)
SODIUM: 143 meq/L (ref 135–145)

## 2014-09-16 ENCOUNTER — Telehealth: Payer: Self-pay | Admitting: Family Medicine

## 2014-09-16 NOTE — Telephone Encounter (Signed)
Pt called wanting to know her lab results

## 2014-09-16 NOTE — Telephone Encounter (Signed)
See lab response

## 2014-09-17 NOTE — Telephone Encounter (Signed)
Discussed results with patient

## 2014-09-17 NOTE — Telephone Encounter (Signed)
[facility] - number not reachable. 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

## 2014-09-26 ENCOUNTER — Encounter: Payer: Self-pay | Admitting: Family Medicine

## 2014-10-22 ENCOUNTER — Encounter: Payer: Self-pay | Admitting: Gastroenterology

## 2015-02-25 ENCOUNTER — Other Ambulatory Visit: Payer: Self-pay

## 2015-02-25 DIAGNOSIS — Z1231 Encounter for screening mammogram for malignant neoplasm of breast: Secondary | ICD-10-CM

## 2015-02-27 ENCOUNTER — Encounter: Payer: Self-pay | Admitting: Gastroenterology

## 2015-03-02 ENCOUNTER — Encounter: Payer: Self-pay | Admitting: Family Medicine

## 2015-03-02 ENCOUNTER — Telehealth: Payer: Self-pay | Admitting: Family Medicine

## 2015-03-02 ENCOUNTER — Ambulatory Visit (INDEPENDENT_AMBULATORY_CARE_PROVIDER_SITE_OTHER): Payer: 59 | Admitting: Family Medicine

## 2015-03-02 VITALS — BP 146/90 | Ht 63.5 in | Wt 154.0 lb

## 2015-03-02 DIAGNOSIS — M792 Neuralgia and neuritis, unspecified: Secondary | ICD-10-CM | POA: Diagnosis not present

## 2015-03-02 MED ORDER — NAPROXEN 500 MG PO TABS: 500.0000 mg | ORAL_TABLET | Freq: Two times a day (BID) | ORAL | Status: DC

## 2015-03-02 MED ORDER — VALACYCLOVIR HCL 1 G PO TABS: 1000.0000 mg | ORAL_TABLET | Freq: Three times a day (TID) | ORAL | Status: DC

## 2015-03-02 NOTE — Telephone Encounter (Signed)
Pt had shingles about 7 years ago and pt states pain feels the same as when she had shingles. Pain and itching on one side of her back. Some redness. Started last Friday and has not eased up except when taking ibuprofen. Advised pt she would need ov. Pt transferred to front to schedule ov today.

## 2015-03-02 NOTE — Telephone Encounter (Signed)
Pt called and would like for a nurse to call her back. Pt believes she may have the beginning stages of shingles but would like to talk to the nurse before making an appt.

## 2015-03-02 NOTE — Progress Notes (Signed)
   Subjective:    Patient ID: Valerie Parker, female    DOB: [birth year], 59 y.o.   MRN: [id number]  HPI Patient has hx of shingles 7 years ago and is now having the same type of nerve pain  On back for several days but doesn't have a rash yet. Pain worse over the weekend Better today No nausea No dysuria Sensitive to the touch Took ibuprofen No triggers, had been putting in posts    Review of Systems  Constitutional: Negative for fever, chills and fatigue.  HENT: Negative for congestion.   Respiratory: Negative for cough.   Cardiovascular: Negative for chest pain.  Gastrointestinal: Negative for constipation, blood in stool and anal bleeding.  Genitourinary: Negative for dysuria, frequency and flank pain.  Skin: Negative for rash.  patient states at times she feels bloated     Objective:   Physical Exam  Lungs are clear hearts regular flanks nontender to percussion sensory tenderness on the right side flank region no rash seen Patient's abdomen did not have any masses felt     Assessment & Plan:  Possible impingement of the sensory nerve in her back. It is also possible this could be a strain and finally could be early shingles prescription for Valtrex given. To get this filled if shingles symptoms turn into blistering  Patient is to follow-up with Dr. Richardson Parker for scheduled appointment on the 29  Patient was encouraged to get regular female health checkup

## 2015-03-02 NOTE — Telephone Encounter (Signed)
[facility]

## 2015-03-05 ENCOUNTER — Telehealth: Payer: Self-pay | Admitting: Family Medicine

## 2015-03-05 MED ORDER — ACYCLOVIR 800 MG PO TABS: 800.0000 mg | ORAL_TABLET | Freq: Every day | ORAL | Status: DC

## 2015-03-05 NOTE — Telephone Encounter (Signed)
Rx sent electronically to pharmacy. Patient notified. 

## 2015-03-05 NOTE — Telephone Encounter (Signed)
Zovirax may or may not be cheaper 800 mg  Have to take five per day 800 mg 5 x per d for 7 d

## 2015-03-05 NOTE — Telephone Encounter (Signed)
Pt called stating that the rash appeared yesterday evening and went to get the medicine filled today and it was going to be a 40 dollar copay and normally she only pays 10 for her medicines. Pt wants to know if there is a cheaper option.  [facility] [employer] 

## 2015-03-05 NOTE — Telephone Encounter (Signed)
Valtrex given for shingles

## 2015-03-12 ENCOUNTER — Ambulatory Visit: Payer: 59

## 2015-03-19 ENCOUNTER — Ambulatory Visit
Admission: RE | Admit: 2015-03-19 | Discharge: 2015-03-19 | Disposition: A | Discharge status: Home / Self Care | Payer: 59 | Source: Ambulatory Visit

## 2015-03-19 DIAGNOSIS — Z1231 Encounter for screening mammogram for malignant neoplasm of breast: Secondary | ICD-10-CM

## 2015-03-20 ENCOUNTER — Ambulatory Visit (INDEPENDENT_AMBULATORY_CARE_PROVIDER_SITE_OTHER): Payer: 59 | Admitting: Family Medicine

## 2015-03-20 ENCOUNTER — Encounter: Payer: Self-pay | Admitting: Family Medicine

## 2015-03-20 VITALS — BP 130/82 | Ht 63.5 in | Wt 154.6 lb

## 2015-03-20 DIAGNOSIS — I10 Essential (primary) hypertension: Secondary | ICD-10-CM

## 2015-03-20 MED ORDER — AMLODIPINE BESYLATE 5 MG PO TABS: 5.0000 mg | ORAL_TABLET | Freq: Every day | ORAL | Status: DC

## 2015-03-20 MED ORDER — LISINOPRIL 40 MG PO TABS: 40.0000 mg | ORAL_TABLET | Freq: Every day | ORAL | Status: DC

## 2015-03-20 NOTE — Progress Notes (Signed)
   Subjective:    Patient ID: Valerie Parker, female    DOB: December 14, 1955, 59 y.o.   MRN: [id number]  Hypertension This is a chronic problem. The current episode started more than 1 year ago. Risk factors for coronary artery disease include dyslipidemia and post-menopausal state. Treatments tried: norvasc, lisinopril.    Numbers elsewhere ck'ed well  Numb in the 140s  syst other number is usually aorund   havent taken bp Med yet today  Not exrecising regulaly      Review of Systems No headache no chest pain no back pain no abdominal pain    Objective:   Physical Exam Alert vital stable H&T normal. Lungs clear. Heart regular in rhythm.       Assessment & Plan:  Impression hypertension good control discussed at length plan maintain same meds. Diet exercise discussed. Check every 6 months. Screening wellness exam with [facility] Sauer soon [facility]

## 2015-04-17 ENCOUNTER — Ambulatory Visit (INDEPENDENT_AMBULATORY_CARE_PROVIDER_SITE_OTHER): Payer: 59 | Admitting: Nurse Practitioner

## 2015-04-17 ENCOUNTER — Encounter: Payer: Self-pay | Admitting: Nurse Practitioner

## 2015-04-17 ENCOUNTER — Encounter: Payer: 59 | Admitting: Nurse Practitioner

## 2015-04-17 VITALS — BP 120/82 | Ht 63.5 in | Wt 153.0 lb

## 2015-04-17 DIAGNOSIS — Z1151 Encounter for screening for human papillomavirus (HPV): Secondary | ICD-10-CM

## 2015-04-17 DIAGNOSIS — L259 Unspecified contact dermatitis, unspecified cause: Secondary | ICD-10-CM | POA: Diagnosis not present

## 2015-04-17 DIAGNOSIS — Z124 Encounter for screening for malignant neoplasm of cervix: Secondary | ICD-10-CM

## 2015-04-17 DIAGNOSIS — Z01419 Encounter for gynecological examination (general) (routine) without abnormal findings: Secondary | ICD-10-CM

## 2015-04-17 DIAGNOSIS — Z0000 Encounter for general adult medical examination without abnormal findings: Secondary | ICD-10-CM

## 2015-04-17 DIAGNOSIS — Z780 Asymptomatic menopausal state: Secondary | ICD-10-CM

## 2015-04-17 MED ORDER — TRIAMCINOLONE ACETONIDE 0.1 % EX CREA: 1.0000 "application " | TOPICAL_CREAM | Freq: Two times a day (BID) | CUTANEOUS | Status: DC

## 2015-04-17 NOTE — Patient Instructions (Signed)
[facility] [place] [facility]

## 2015-04-18 ENCOUNTER — Encounter: Payer: Self-pay | Admitting: Nurse Practitioner

## 2015-04-18 NOTE — Progress Notes (Addendum)
Subjective:    Patient ID: Valerie Parker, female    DOB: 18-May-1956, 59 y.o.   MRN: [id number]  HPI presents for her wellness exam. Widowed. Not sexually active. No vaginal bleeding or pelvic pain. Some vaginal discomfort and dryness. Needs vision and dental exams. Had shingles recently; this is her second episode. Has Rx for zostavax but was told to hold for 2 months. Has had 2 cancerous lesions removed from her face, the last was about 1 1/2-2 years ago. Overall healthy diet. Active. Has colonoscopy scheduled.     Review of Systems  Constitutional: Negative for fever, activity change, appetite change and fatigue.  HENT: Negative for dental problem, ear pain, sinus pressure and sore throat.   Respiratory: Negative for cough, chest tightness, shortness of breath and wheezing.   Cardiovascular: Negative for chest pain.  Gastrointestinal: Negative for nausea, vomiting, abdominal pain, diarrhea, constipation and abdominal distention.  Genitourinary: Negative for dysuria, urgency, frequency, vaginal bleeding, vaginal discharge, enuresis, difficulty urinating, genital sores and pelvic pain.       Objective:   Physical Exam  Constitutional: She is oriented to person, place, and time. She appears well-developed. No distress.  HENT:  Right Ear: External ear normal.  Left Ear: External ear normal.  Mouth/Throat: Oropharynx is clear and moist.  Neck: Normal range of motion. Neck supple. No tracheal deviation present. No thyromegaly present.  Cardiovascular: Normal rate, regular rhythm and normal heart sounds.  Exam reveals no gallop.   No murmur heard. Pulmonary/Chest: Effort normal and breath sounds normal.  Abdominal: Soft. She exhibits no distension. There is no tenderness.  Genitourinary: Vagina normal and uterus normal. No vaginal discharge found.  External GU: pale and dry; signs of hypoestrogenism noted. Vagina: pale and dry; no discharge. Cervix: normal. No CMT. Bimanual exam: no  tenderness or obvious masses; vaginal discomfort during speculum exam. Rectal exam deferred; has appt for colonoscopy.   Musculoskeletal: She exhibits no edema.  Lymphadenopathy:    She has no cervical adenopathy.  Neurological: She is alert and oriented to person, place, and time.  Skin: Skin is warm and dry. No rash noted.  Significant sun damage noted. Slight rash on left arm x 2 d. Pruritic. Faint pink papules left forearm.   Psychiatric: She has a normal mood and affect. Her behavior is normal.  Vitals reviewed. Breast exam: no masses; axillae no adenopathy.         Assessment & Plan:  Well woman exam - Plan: Pap IG and HPV (high risk) DNA detection  Screening for cervical cancer - Plan: Pap IG and HPV (high risk) DNA detection  Screening for HPV (human papillomavirus) - Plan: Pap IG and HPV (high risk) DNA detection  Menopause - Plan: DG Bone Density  Contact dermatitis   Meds ordered this encounter  Medications  . triamcinolone cream (KENALOG) 0.1 %    Sig: Apply 1 application topically 2 (two) times daily. Prn rash; use up to 2 weeks    Dispense:  30 g    Refill:  0    Order Specific Question:  Supervising Provider    Answer:  Mikey Kirschner [[id number]]   Recommend daily vitamin D and calcium, vision and dental exams. Also recommend recheck with dermatology for skin cancer screening.  Return in about 1 year (around 04/16/2016) for physical. Note: patient has had her nail on her right great toe removed. A small piece is growing back. No signs of infection. Patient to make an appt with her  podiatrist for recheck.

## 2015-04-21 ENCOUNTER — Telehealth: Payer: Self-pay | Admitting: Family Medicine

## 2015-04-21 ENCOUNTER — Ambulatory Visit (AMBULATORY_SURGERY_CENTER): Payer: Self-pay

## 2015-04-21 VITALS — Ht 63.0 in | Wt 153.4 lb

## 2015-04-21 DIAGNOSIS — Z86010 Personal history of colonic polyps: Secondary | ICD-10-CM

## 2015-04-21 MED ORDER — MOVIPREP 100 G PO SOLR: ORAL | Status: DC

## 2015-04-21 NOTE — Telephone Encounter (Signed)
Pt states she needs a referral to her [facility] doc for a colonoscopy  She is using [facility] out of [place] Dr. Owens Loffler   They are on the Epic system   You can use the fax of [phone]  Has scheduled the appt already for 9/13

## 2015-04-21 NOTE — Progress Notes (Signed)
Per pt, no allergies to soy or egg products.Pt not taking any weight loss meds or using  O2 at home. 

## 2015-04-21 NOTE — Telephone Encounter (Signed)
Izora Ribas has already handled this referral and talked with patient.

## 2015-04-22 ENCOUNTER — Other Ambulatory Visit (HOSPITAL_COMMUNITY): Payer: 59

## 2015-04-22 LAB — PAP IG AND HPV HIGH-RISK
HPV, HIGH-RISK: NEGATIVE
PAP Smear Comment: 0

## 2015-04-28 ENCOUNTER — Encounter: Payer: Self-pay | Admitting: Nurse Practitioner

## 2015-04-28 ENCOUNTER — Ambulatory Visit (HOSPITAL_COMMUNITY)
Admission: RE | Admit: 2015-04-28 | Discharge: 2015-04-28 | Disposition: A | Discharge status: Home / Self Care | Payer: 59 | Source: Ambulatory Visit | Attending: Nurse Practitioner | Admitting: Nurse Practitioner

## 2015-04-28 DIAGNOSIS — M8588 Other specified disorders of bone density and structure, other site: Secondary | ICD-10-CM | POA: Diagnosis not present

## 2015-04-28 DIAGNOSIS — M8580 Other specified disorders of bone density and structure, unspecified site: Secondary | ICD-10-CM | POA: Insufficient documentation

## 2015-04-28 DIAGNOSIS — Z780 Asymptomatic menopausal state: Secondary | ICD-10-CM | POA: Insufficient documentation

## 2015-04-30 ENCOUNTER — Telehealth: Payer: Self-pay | Admitting: Gastroenterology

## 2015-04-30 NOTE — Telephone Encounter (Signed)
Pt cx the colon due to insurance issues, she will call back to reschedule after she gets her insurance again

## 2015-05-05 ENCOUNTER — Encounter: Payer: 59 | Admitting: Gastroenterology

## 2015-07-28 ENCOUNTER — Ambulatory Visit: Payer: 59 | Admitting: Family Medicine

## 2015-08-27 ENCOUNTER — Ambulatory Visit (AMBULATORY_SURGERY_CENTER): Payer: Self-pay

## 2015-08-27 VITALS — Ht 62.0 in | Wt 152.4 lb

## 2015-08-27 DIAGNOSIS — Z860100 Personal history of colon polyps, unspecified: Secondary | ICD-10-CM

## 2015-08-27 NOTE — Progress Notes (Signed)
No allergies to eggs or soy No diet/weight loss meds No home oxyen No past problems with anesthesia  Has email and internet; refused emmi

## 2015-09-09 ENCOUNTER — Encounter: Payer: Self-pay | Admitting: Gastroenterology

## 2015-09-09 ENCOUNTER — Ambulatory Visit (AMBULATORY_SURGERY_CENTER): Payer: BLUE CROSS/BLUE SHIELD | Admitting: Gastroenterology

## 2015-09-09 VITALS — BP 103/61 | HR 62 | Temp 96.8°F | Resp 20 | Ht 62.0 in | Wt 152.0 lb

## 2015-09-09 DIAGNOSIS — Z86010 Personal history of colonic polyps: Secondary | ICD-10-CM

## 2015-09-09 MED ORDER — SODIUM CHLORIDE 0.9 % IV SOLN: 500.0000 mL | INTRAVENOUS | Status: DC

## 2015-09-09 NOTE — Progress Notes (Signed)
To recovery, report to Myers, RN, VSS. 

## 2015-09-09 NOTE — Op Note (Signed)
[place]  [address] [place], [address]   COLONOSCOPY PROCEDURE REPORT  PATIENT: Valerie Parker, Valerie Parker  MR#: [id number] BIRTHDATE: [birth year] , 59  yrs. old GENDER: female ENDOSCOPIST: Milus Banister, MD PROCEDURE DATE:  09/09/2015 PROCEDURE:   Colonoscopy, surveillance First Screening Colonoscopy - Avg.  risk and is 50 yrs.  old or older - No.  Prior Negative Screening - Now for repeat screening. N/A  History of Adenoma - Now for follow-up colonoscopy & has been > or = to 3 yrs.  Yes hx of adenoma.  Has been 3 or more years since last colonoscopy.  Recommend repeat exam, <10 yrs? No ASA CLASS:   Class II INDICATIONS:Surveillance due to prior colonic neoplasia and Colonoscopy 2010, one subCM tubular adenoma. MEDICATIONS: Monitored anesthesia care and Propofol 200 mg IV  DESCRIPTION OF PROCEDURE:   After the risks benefits and alternatives of the procedure were thoroughly explained, informed consent was obtained.  The digital rectal exam revealed no abnormalities of the rectum.   The LB [id number]  endoscope was introduced through the anus and advanced to the cecum, which was identified by both the appendix and ileocecal valve. No adverse events experienced.   The quality of the prep was excellent.  The instrument was then slowly withdrawn as the colon was fully examined. Estimated blood loss is zero unless otherwise noted in this procedure report.   COLON FINDINGS: A normal appearing cecum, ileocecal valve, and appendiceal orifice were identified.  The ascending, transverse, descending, sigmoid colon, and rectum appeared unremarkable. Retroflexed views revealed no abnormalities. The time to cecum = 2.7 Withdrawal time = 7.0   The scope was withdrawn and the procedure completed. COMPLICATIONS: There were no immediate complications.  ENDOSCOPIC IMPRESSION: Normal colonoscopy No polyps or cancers  RECOMMENDATIONS: You should continue to follow  colorectal cancer screening guidelines for "routine risk" patients with a repeat colonoscopy in 10 years.   eSigned:  Milus Banister, MD 09/09/2015 10:15 AM

## 2015-09-09 NOTE — Patient Instructions (Signed)
YOU HAD AN ENDOSCOPIC PROCEDURE TODAY AT THE [facility]:   Refer to the procedure report that was given to you for any specific questions about what was found during the examination.  If the procedure report does not answer your questions, please call your gastroenterologist to clarify.  If you requested that your care partner not be given the details of your procedure findings, then the procedure report has been included in a sealed envelope for you to review at your convenience later.  YOU SHOULD EXPECT: Some feelings of bloating in the abdomen. Passage of more gas than usual.  Walking can help get rid of the air that was put into your GI tract during the procedure and reduce the bloating. If you had a lower endoscopy (such as a colonoscopy or flexible sigmoidoscopy) you may notice spotting of blood in your stool or on the toilet paper. If you underwent a bowel prep for your procedure, you may not have a normal bowel movement for a few days.  Please Note:  You might notice some irritation and congestion in your nose or some drainage.  This is from the oxygen used during your procedure.  There is no need for concern and it should clear up in a day or so.  SYMPTOMS TO REPORT IMMEDIATELY:   Following lower endoscopy (colonoscopy or flexible sigmoidoscopy):  Excessive amounts of blood in the stool  Significant tenderness or worsening of abdominal pains  Swelling of the abdomen that is new, acute  Fever of 100F or higher  For urgent or emergent issues, a gastroenterologist can be reached at any hour by calling [phone].   DIET: Your first meal following the procedure should be a small meal and then it is ok to progress to your normal diet. Heavy or fried foods are harder to digest and may make you feel nauseous or bloated.  Likewise, meals heavy in dairy and vegetables can increase bloating.  Drink plenty of fluids but you should avoid alcoholic beverages for 24  hours.  ACTIVITY:  You should plan to take it easy for the rest of today and you should NOT DRIVE or use heavy machinery until tomorrow (because of the sedation medicines used during the test).    FOLLOW UP: Our staff will call the number listed on your records the next business day following your procedure to check on you and address any questions or concerns that you may have regarding the information given to you following your procedure. If we do not reach you, we will leave a message.  However, if you are feeling well and you are not experiencing any problems, there is no need to return our call.  We will assume that you have returned to your regular daily activities without incident.  If any biopsies were taken you will be contacted by phone or by letter within the next 1-3 weeks.  Please call us at [phone] if you have not heard about the biopsies in 3 weeks.    SIGNATURES/CONFIDENTIALITY: You and/or your care partner have signed paperwork which will be entered into your electronic medical record.  These signatures attest to the fact that that the information above on your After Visit Summary has been reviewed and is understood.  Full responsibility of the confidentiality of this discharge information lies with you and/or your care-partner.  Next colonoscopy in 10 years. 

## 2015-09-16 ENCOUNTER — Ambulatory Visit: Payer: 59 | Admitting: Family Medicine

## 2015-09-28 ENCOUNTER — Other Ambulatory Visit: Payer: Self-pay | Admitting: Family Medicine

## 2015-09-28 MED FILL — AMLODIPINE BESYLATE 5 MG TA: 5 | 90 days supply | Qty: 90 | Fill #0

## 2015-09-28 MED FILL — LISINOPRIL 40 MG TABLET: 40 | 30 days supply | Qty: 30 | Fill #4

## 2015-10-01 ENCOUNTER — Ambulatory Visit (INDEPENDENT_AMBULATORY_CARE_PROVIDER_SITE_OTHER): Payer: BLUE CROSS/BLUE SHIELD | Admitting: Family Medicine

## 2015-10-01 ENCOUNTER — Ambulatory Visit (HOSPITAL_COMMUNITY)
Admission: RE | Admit: 2015-10-01 | Discharge: 2015-10-01 | Disposition: A | Discharge status: Home / Self Care | Payer: BLUE CROSS/BLUE SHIELD | Source: Ambulatory Visit | Attending: Family Medicine | Admitting: Family Medicine

## 2015-10-01 ENCOUNTER — Encounter: Payer: Self-pay | Admitting: Family Medicine

## 2015-10-01 VITALS — BP 118/80 | Ht 63.5 in | Wt 151.8 lb

## 2015-10-01 DIAGNOSIS — M25562 Pain in left knee: Secondary | ICD-10-CM | POA: Diagnosis not present

## 2015-10-01 DIAGNOSIS — I10 Essential (primary) hypertension: Secondary | ICD-10-CM | POA: Diagnosis not present

## 2015-10-01 DIAGNOSIS — M79641 Pain in right hand: Secondary | ICD-10-CM | POA: Insufficient documentation

## 2015-10-01 MED ORDER — LISINOPRIL 40 MG PO TABS: 40.0000 mg | ORAL_TABLET | Freq: Every day | ORAL | Status: DC

## 2015-10-01 MED ORDER — ETODOLAC 400 MG PO TABS: 400.0000 mg | ORAL_TABLET | Freq: Two times a day (BID) | ORAL | Status: DC

## 2015-10-01 MED ORDER — AMLODIPINE BESYLATE 5 MG PO TABS
ORAL_TABLET | ORAL | Status: DC
Start: 2015-10-01 — End: 2016-10-13

## 2015-10-01 NOTE — Progress Notes (Signed)
   Subjective:    Patient ID: Valerie Parker, female    DOB: 08/26/1955, 60 y.o.   MRN: [id number] Patient arrives office with several distinct concerns   Hypertension This is a chronic problem. The current episode started more than 1 year ago. Risk factors for coronary artery disease include post-menopausal state. Treatments tried: lisinopril, norvasc. There are no compliance problems.   BP meds faithful, number running good  Walk with the dogs and exercises some, tries to stay actice  Left knee pain .  and possible gout in right hand  Hx of "pseudogout " in toe  Notes intermittent hand pain , fairly severd in nature, swolen right , just holding hand sometimes  Likely falsi gout  Knee injury several months ago, took hard fall on the pen bar and had terible pain early on, front part of knee, had worn a knee pad with foam to keep it from getting hit, Very bad pain still when it bumps, pt frustrated about it , states knee did not swell up      Review of Systems No headache no chest pain no back pain no abdominal pain no change in bowel habits ROS otherwise negative    Objective:   Physical Exam  Alert vitals stable. HEENT normal blood pressure good on repeat. Lungs clear heart regular in rhythm hands bilateral prominent knuckles but no true Heberden's nodes. Pulses sensation all intact. No obvious synovial swelling some pain right metacarpal phalangeal joint at the base left knee no effusion good range of motion no joint line tenderness no joint laxity some tenderness to deep palpation anterior patella some crepitation slight anterior patella      Assessment & Plan:  the impression 1 hypertension good control meds reviewed today maintain same #2 and pain likely tendinitis patient worried about arthritis #3 left knee injury likely prepatellar bursitis patient worried about her patella. Plan x-ray left knee and right hand. Further recommendations based on these results. Maintain same  blood pressure medication. Diet exercise discussed meds refilled recheck in 6 months

## 2015-10-02 ENCOUNTER — Telehealth: Payer: Self-pay | Admitting: Family Medicine

## 2015-10-02 NOTE — Telephone Encounter (Signed)
Pt called wanting to know the results of her X-rays. I informed the pt that Dr. Richardson Landry is not here today that it may be Monday.

## 2015-11-13 MED FILL — LISINOPRIL 40 MG TABLET: 40 | 90 days supply | Qty: 90 | Fill #0

## 2015-11-20 ENCOUNTER — Ambulatory Visit (INDEPENDENT_AMBULATORY_CARE_PROVIDER_SITE_OTHER): Payer: BLUE CROSS/BLUE SHIELD | Admitting: Family Medicine

## 2015-11-20 ENCOUNTER — Encounter: Payer: Self-pay | Admitting: Family Medicine

## 2015-11-20 VITALS — BP 128/90 | Ht 63.0 in | Wt 155.0 lb

## 2015-11-20 DIAGNOSIS — M25562 Pain in left knee: Secondary | ICD-10-CM | POA: Diagnosis not present

## 2015-11-20 NOTE — Progress Notes (Signed)
   Subjective:    Patient ID: Valerie Parker, female    DOB: Dec 15, 1955, 60 y.o.   MRN: [id number]  HPIFollow up left knee pain. Pain is about the same. Had xray in February.  Burning sensation when walking and feels something catching in knee. Uncomfortable when bending knee.  Has knee injury back in June. Taking etodolac bid with no relief.  Knee still very painful  Feels a catching sensation in the knee  Burning sens too  Has tried knee pad to see if helps  Walks a lot, reg walking does not cause sig pain  Uncomfortable   Very painful in left knee ant in nature  [address] having ant knee pain    Review of Systems No headache no back pain no chest pain    Objective:   Physical Exam  Alert vital stable. Lungs clear heart regular rate and rhythm. Left knee mild crepitations with extension and flexion no joint laxity noted joint line tenderness question slight effusion next  See prior x-rays      Assessment & Plan:  Impression persistent anterior compartment knee pain post remote injury. Working diagnosis has been prepatellar bursitis however with persistent pain and discomfort need a second opinion plan orthopedic referral rationale discussed W[facility]

## 2015-11-24 ENCOUNTER — Encounter: Payer: Self-pay | Admitting: Family Medicine

## 2015-11-30 DIAGNOSIS — M6281 Muscle weakness (generalized): Secondary | ICD-10-CM | POA: Diagnosis not present

## 2015-11-30 DIAGNOSIS — M2242 Chondromalacia patellae, left knee: Secondary | ICD-10-CM | POA: Diagnosis not present

## 2016-03-04 MED FILL — LISINOPRIL 40 MG TABLET: 40 | 90 days supply | Qty: 90 | Fill #1

## 2016-10-11 DIAGNOSIS — I10 Essential (primary) hypertension: Secondary | ICD-10-CM | POA: Diagnosis not present

## 2016-10-11 DIAGNOSIS — H7091 Unspecified mastoiditis, right ear: Secondary | ICD-10-CM | POA: Diagnosis not present

## 2016-10-13 ENCOUNTER — Encounter: Payer: Self-pay | Admitting: Family Medicine

## 2016-10-13 ENCOUNTER — Ambulatory Visit (INDEPENDENT_AMBULATORY_CARE_PROVIDER_SITE_OTHER): Payer: BLUE CROSS/BLUE SHIELD | Admitting: Family Medicine

## 2016-10-13 VITALS — BP 130/80 | Temp 98.1°F | Ht 63.0 in | Wt 156.0 lb

## 2016-10-13 DIAGNOSIS — K1121 Acute sialoadenitis: Secondary | ICD-10-CM

## 2016-10-13 MED ORDER — AMOXICILLIN-POT CLAVULANATE 875-125 MG PO TABS: 1.0000 | ORAL_TABLET | Freq: Two times a day (BID) | ORAL | 0 refills | Status: AC

## 2016-10-13 NOTE — Patient Instructions (Signed)
Acute parotid gland infection  Get some lemon drops and suck on through out the day

## 2016-10-13 NOTE — Progress Notes (Signed)
   Subjective:    Patient ID: Valerie Parker, female    DOB: January 26, 1956, 61 y.o.   MRN: [id number]  HPI Patient is here today for a swollen area under her right ear. Tenderness and pain noted. Onset 4-5 days ago. Treatments tried: amoxicillin with no relief.   Felt achey and flu like symtoms  Noted swelling and tend in neck,   Felt some what chille and dizzy  Had sig diarrhea nad felt like was going to pass out  Patient has a bump on her left arm also.    Review of Systems No headache, no major weight loss or weight gain, no chest pain no back pain abdominal pain no change in bowel habits complete ROS otherwise negative     Objective:   Physical Exam Alert vitals stable, NAD. Blood pressure good on repeat. HEENT Right parotid gland tenderness and inflammation, with normal lymph nodes otherwise normal. Lungs clear. Heart regular rate and rhythm.        Assessment & Plan:  Impression acute parotitis discussed plan Augmentin twice per day. Symptom care discussed. We'll need to clarify with pharmacy whether this is the form of antibiotics given the patient. Sour candy to suck on encourage if resolves over the next week fine and continues persists call back and we will set up with ENT

## 2016-10-20 ENCOUNTER — Emergency Department (HOSPITAL_COMMUNITY)
Admission: EM | Admit: 2016-10-20 | Discharge: 2016-10-20 | Disposition: A | Discharge status: Home / Self Care | Payer: PRIVATE HEALTH INSURANCE | Attending: Emergency Medicine | Admitting: Emergency Medicine

## 2016-10-20 DIAGNOSIS — Y939 Activity, unspecified: Secondary | ICD-10-CM | POA: Diagnosis not present

## 2016-10-20 DIAGNOSIS — R51 Headache: Secondary | ICD-10-CM | POA: Insufficient documentation

## 2016-10-20 DIAGNOSIS — Y990 Civilian activity done for income or pay: Secondary | ICD-10-CM | POA: Insufficient documentation

## 2016-10-20 DIAGNOSIS — I10 Essential (primary) hypertension: Secondary | ICD-10-CM | POA: Diagnosis not present

## 2016-10-20 DIAGNOSIS — Z79899 Other long term (current) drug therapy: Secondary | ICD-10-CM | POA: Insufficient documentation

## 2016-10-20 DIAGNOSIS — Y929 Unspecified place or not applicable: Secondary | ICD-10-CM | POA: Insufficient documentation

## 2016-10-20 DIAGNOSIS — Z85828 Personal history of other malignant neoplasm of skin: Secondary | ICD-10-CM | POA: Diagnosis not present

## 2016-10-20 MED ORDER — IBUPROFEN 200 MG PO TABS
600.0000 mg | ORAL_TABLET | Freq: Once | ORAL | Status: AC
  Administered 2016-10-20: 600 mg via ORAL
  Filled 2016-10-20: qty 1

## 2016-10-20 NOTE — ED Notes (Signed)
Pt stable, understands discharge instructions, and reasons for return.   
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
## 2016-10-25 ENCOUNTER — Telehealth: Payer: Self-pay | Admitting: Family Medicine

## 2016-10-25 DIAGNOSIS — D492 Neoplasm of unspecified behavior of bone, soft tissue, and skin: Secondary | ICD-10-CM

## 2016-10-25 NOTE — Telephone Encounter (Signed)
Lets refer, brendale to clarify pts wishes and work with what is out there

## 2016-10-25 NOTE — Telephone Encounter (Signed)
Patient called saying she has another "skin cancer" on her arm and wanted to see if we could just refer her to a Dermatologist again to have it cut out.  Saw Dr. Nevada Crane last time and doesn't want to see him again but doesn't really want to go to [facility].  I told her the choices were limited in this area.  Please call with advice. (Asked if we could just cut it out here.)

## 2016-10-26 NOTE — Telephone Encounter (Signed)
[facility] for pt Can refer to [facility] or [facility]

## 2016-10-28 ENCOUNTER — Encounter: Payer: Self-pay | Admitting: Family Medicine

## 2016-10-28 NOTE — Telephone Encounter (Signed)
Spoke with pt, she'd like to stay local, asked for Dr. Tarri Glenn Referral faxed, they'll contact pt, pt aware

## 2016-11-10 DIAGNOSIS — D485 Neoplasm of uncertain behavior of skin: Secondary | ICD-10-CM | POA: Diagnosis not present

## 2016-11-10 DIAGNOSIS — L858 Other specified epidermal thickening: Secondary | ICD-10-CM | POA: Diagnosis not present

## 2016-11-10 DIAGNOSIS — Z85828 Personal history of other malignant neoplasm of skin: Secondary | ICD-10-CM | POA: Diagnosis not present

## 2016-11-17 DIAGNOSIS — C44629 Squamous cell carcinoma of skin of left upper limb, including shoulder: Secondary | ICD-10-CM | POA: Diagnosis not present

## 2017-02-23 ENCOUNTER — Other Ambulatory Visit: Payer: Self-pay | Admitting: *Deleted

## 2017-02-23 DIAGNOSIS — I10 Essential (primary) hypertension: Secondary | ICD-10-CM

## 2017-02-23 MED ORDER — LISINOPRIL 40 MG PO TABS: 40.0000 mg | ORAL_TABLET | Freq: Every day | ORAL | 0 refills | Status: DC

## 2017-02-24 ENCOUNTER — Telehealth: Payer: Self-pay | Admitting: Family Medicine

## 2017-02-24 DIAGNOSIS — I10 Essential (primary) hypertension: Secondary | ICD-10-CM

## 2017-02-24 MED ORDER — LISINOPRIL 40 MG PO TABS: 40.0000 mg | ORAL_TABLET | Freq: Every day | ORAL | 0 refills | Status: DC

## 2017-02-24 NOTE — Telephone Encounter (Signed)
Cancelled the rx at [facility] out pt clinic. Forwarded the rx to [facility]. Pt is aware.

## 2017-02-24 NOTE — Telephone Encounter (Signed)
Patient said that [facility] requested a refill on her lisinopril yesterday, but it was sent to [facility].  She is requesting that we send this to [facility].

## 2017-04-03 ENCOUNTER — Encounter: Payer: Self-pay | Admitting: Family Medicine

## 2017-04-03 ENCOUNTER — Ambulatory Visit (INDEPENDENT_AMBULATORY_CARE_PROVIDER_SITE_OTHER): Payer: BLUE CROSS/BLUE SHIELD | Admitting: Family Medicine

## 2017-04-03 VITALS — BP 138/74 | Ht 63.0 in | Wt 154.0 lb

## 2017-04-03 DIAGNOSIS — I10 Essential (primary) hypertension: Secondary | ICD-10-CM

## 2017-04-03 DIAGNOSIS — D492 Neoplasm of unspecified behavior of bone, soft tissue, and skin: Secondary | ICD-10-CM

## 2017-04-03 MED ORDER — LISINOPRIL 40 MG PO TABS: 40.0000 mg | ORAL_TABLET | Freq: Every day | ORAL | 1 refills | Status: DC

## 2017-04-03 MED ORDER — NIFEDIPINE 10 MG PO CAPS: ORAL_CAPSULE | ORAL | 1 refills | Status: DC

## 2017-04-03 NOTE — Progress Notes (Signed)
   Subjective:    Patient ID: Valerie Parker, female    DOB: 05/03/1956, 61 y.o.   MRN: [id number]  HPI Patient in today for a sore on left lower leg. Onset 1 month ago.  Started as small bump is expanded positive history of basal cell skin cancer 2   Blood pressure medicine and blood pressure levels reviewed today with patient. Compliant with blood pressure medicine. States does not miss a dose. No obvious side effects. Blood pressure generally good when checked elsewhere. Watching salt intake.  Notes patient's blood pressure has been up more recently when she checks it elsewhere.  States no other concerns this visit.    Review of Systems No headache, no major weight loss or weight gain, no chest pain no back pain abdominal pain no change in bowel habits complete ROS otherwise negative     Objective:   Physical Exam   Alert vitals stable, NAD. Blood pressure good on repeat. HEENT normal. Lungs clear. Heart regular rate and rhythm. Left anterior leg small crusty pearly erythematous lesion     Assessment & Plan:  Impression 1 hypertension suboptimal control discussed #2 probable basal cell skin cancer plan encouraged to get back to her dermatologist Dr. Tarri Glenn. Add Norvasc 5 mg to the lisinopril diet exercise discussed check every 6 months also preventive women's health checkup encourage

## 2017-04-06 ENCOUNTER — Telehealth: Payer: Self-pay

## 2017-04-06 ENCOUNTER — Other Ambulatory Visit: Payer: Self-pay

## 2017-04-06 MED ORDER — AMLODIPINE BESYLATE 5 MG PO TABS: 5.0000 mg | ORAL_TABLET | Freq: Every day | ORAL | 1 refills | Status: DC

## 2017-04-06 NOTE — Addendum Note (Signed)
Addended by: Karle Barr on: 04/06/2017 04:15 PM   Modules accepted: Orders

## 2017-04-06 NOTE — Telephone Encounter (Signed)
Change to 5 mg

## 2017-04-06 NOTE — Telephone Encounter (Signed)
Pharmacist Robin called from [facility] stating you had called in Nifedipine 10 mg take .5 tablet po daily. She states this medication only comes in capsules,so this dose would not work. The lowest mg is 10. Please advise.

## 2017-04-06 NOTE — Telephone Encounter (Signed)
Actually what I originally recommended was norvasc five mg, which is amlodipine 5 mg daily, closely related to nifedipine but separate. plz rx norvasc five mg, generic fine one daily, six mo worth

## 2017-04-06 NOTE — Telephone Encounter (Signed)
The lowest dose that this medication comes in is 10 mg capsules. Cant half due to being a capsule.

## 2017-04-17 DIAGNOSIS — L570 Actinic keratosis: Secondary | ICD-10-CM | POA: Diagnosis not present

## 2017-04-17 DIAGNOSIS — D485 Neoplasm of uncertain behavior of skin: Secondary | ICD-10-CM | POA: Diagnosis not present

## 2017-04-17 DIAGNOSIS — L720 Epidermal cyst: Secondary | ICD-10-CM | POA: Diagnosis not present

## 2017-10-05 ENCOUNTER — Ambulatory Visit: Payer: BLUE CROSS/BLUE SHIELD | Admitting: Family Medicine

## 2017-10-09 ENCOUNTER — Encounter: Payer: Self-pay | Admitting: Family Medicine

## 2017-10-09 ENCOUNTER — Ambulatory Visit (INDEPENDENT_AMBULATORY_CARE_PROVIDER_SITE_OTHER): Payer: BLUE CROSS/BLUE SHIELD | Admitting: Family Medicine

## 2017-10-09 VITALS — BP 138/88 | Ht 63.0 in | Wt 152.0 lb

## 2017-10-09 DIAGNOSIS — I10 Essential (primary) hypertension: Secondary | ICD-10-CM

## 2017-10-09 DIAGNOSIS — Z79899 Other long term (current) drug therapy: Secondary | ICD-10-CM

## 2017-10-09 DIAGNOSIS — Z13220 Encounter for screening for lipoid disorders: Secondary | ICD-10-CM

## 2017-10-09 MED ORDER — AMLODIPINE BESYLATE 5 MG PO TABS: 5.0000 mg | ORAL_TABLET | Freq: Every day | ORAL | 1 refills | Status: DC

## 2017-10-09 MED ORDER — LISINOPRIL 40 MG PO TABS: 40.0000 mg | ORAL_TABLET | Freq: Every day | ORAL | 1 refills | Status: DC

## 2017-10-09 NOTE — Progress Notes (Signed)
   Subjective:    Patient ID: Valerie Parker, female    DOB: May 01, 1956, 62 y.o.   MRN: [id number]  HPI  Patient is here today to follow up on HTN.She takes Amlodipine 5 mg daily. She does not see any specialist.She states she does not eat as healthy as she should.She exercises.Knee is still bothering her.  Blood pressure medicine and blood pressure levels reviewed today with patient. Compliant with blood pressure medicine. States does not miss a dose. No obvious side effects. Blood pressure generally good when checked elsewhere. Watching salt intake.   140 ish   Over 36 ish  Compliant meds   starya active   plsu exrcise, keeping very busy   Ortho in [place]    Review of Systems No headache, no major weight loss or weight gain, no chest pain no back pain abdominal pain no change in bowel habits complete ROS otherwise negative     Objective:   Physical Exam  Alert vitals stable, NAD. Blood pressure good on repeat. HEENT normal. Lungs clear. Heart regular rate and rhythm. Left knee exquisitely tender on the surface of the patella mild swelling today      Assessment & Plan:  Impression 1 hypertension good control discussed maintain same meds  2.  Prepatellar bursitis local measures discussed.  Salt 1 orthopedist in the past and did not good experience.  May decide to see another we have offered this patient medications refilled appropriate blood work recheck in 6 months

## 2017-10-17 DIAGNOSIS — Z79899 Other long term (current) drug therapy: Secondary | ICD-10-CM | POA: Diagnosis not present

## 2017-10-17 DIAGNOSIS — I10 Essential (primary) hypertension: Secondary | ICD-10-CM | POA: Diagnosis not present

## 2017-10-17 DIAGNOSIS — Z13220 Encounter for screening for lipoid disorders: Secondary | ICD-10-CM | POA: Diagnosis not present

## 2017-10-18 LAB — LIPID PANEL
CHOLESTEROL TOTAL: 215 mg/dL — AB (ref 100–199)
Chol/HDL Ratio: 3.6 ratio (ref 0.0–4.4)
HDL: 59 mg/dL (ref >39)
LDL CALC: 146 mg/dL — AB (ref 0–99)
Triglycerides: 49 mg/dL (ref 0–149)
VLDL CHOLESTEROL CAL: 10 mg/dL (ref 5–40)

## 2017-10-18 LAB — BASIC METABOLIC PANEL
BUN/Creatinine Ratio: 16 (ref 12–28)
BUN: 13 mg/dL (ref 8–27)
CALCIUM: 9.5 mg/dL (ref 8.7–10.3)
CO2: 25 mmol/L (ref 20–29)
CREATININE: 0.79 mg/dL (ref 0.57–1.00)
Chloride: 102 mmol/L (ref 96–106)
GFR calc Af Amer: 93 mL/min/{1.73_m2} (ref >59)
GFR calc non Af Amer: 80 mL/min/{1.73_m2} (ref >59)
GLUCOSE: 97 mg/dL (ref 65–99)
Potassium: 4.3 mmol/L (ref 3.5–5.2)
Sodium: 142 mmol/L (ref 134–144)

## 2017-10-18 LAB — HEPATIC FUNCTION PANEL
ALT: 25 IU/L (ref 0–32)
AST: 17 IU/L (ref 0–40)
Albumin: 4.4 g/dL (ref 3.6–4.8)
Alkaline Phosphatase: 85 IU/L (ref 39–117)
BILIRUBIN TOTAL: 0.9 mg/dL (ref 0.0–1.2)
BILIRUBIN, DIRECT: 0.18 mg/dL (ref 0.00–0.40)
TOTAL PROTEIN: 7.5 g/dL (ref 6.0–8.5)

## 2017-10-22 ENCOUNTER — Encounter: Payer: Self-pay | Admitting: Family Medicine

## 2018-05-30 ENCOUNTER — Other Ambulatory Visit: Payer: Self-pay | Admitting: Family Medicine

## 2018-05-30 DIAGNOSIS — I10 Essential (primary) hypertension: Secondary | ICD-10-CM

## 2018-06-12 ENCOUNTER — Ambulatory Visit (INDEPENDENT_AMBULATORY_CARE_PROVIDER_SITE_OTHER): Payer: BLUE CROSS/BLUE SHIELD | Admitting: Family Medicine

## 2018-06-12 ENCOUNTER — Encounter: Payer: Self-pay | Admitting: Family Medicine

## 2018-06-12 VITALS — BP 118/80 | Ht 63.0 in | Wt 155.4 lb

## 2018-06-12 DIAGNOSIS — I10 Essential (primary) hypertension: Secondary | ICD-10-CM

## 2018-06-12 MED ORDER — AMLODIPINE BESYLATE 5 MG PO TABS: 5.0000 mg | ORAL_TABLET | Freq: Every day | ORAL | 1 refills | Status: DC

## 2018-06-12 MED ORDER — LISINOPRIL 40 MG PO TABS: 40.0000 mg | ORAL_TABLET | Freq: Every day | ORAL | 1 refills | Status: DC

## 2018-06-12 NOTE — Progress Notes (Signed)
   Subjective:    Patient ID: Valerie Parker, female    DOB: 24-Aug-1955, 62 y.o.   MRN: [id number]  Hypertension  This is a chronic problem. There are no compliance problems (takes meds every day, eats healthy, no regular exercise but always busy and does not sit. works and babysits 4 grandchildren).    Bubble in left eye. Started July 2nd. Some eye discomfort. Saw eye dr but the bubble had flattened that day. Bubble comes and goes. Pt did bring in pictures of her eye. The bubble seems to move around.    Blood pressure medicine and blood pressure levels reviewed today with patient. Compliant with blood pressure medicine. States does not miss a dose. No obvious side effects. Blood pressure generally good when checked elsewhere. Watching salt intake.   cks b p overall 131 over 80 to84 usually  averag overall is good      Review of Systems No headache, no major weight loss or weight gain, no chest pain no back pain abdominal pain no change in bowel habits complete ROS otherwise negative     Objective:   Physical Exam  Alert vitals stable, NAD. Blood pressure good on repeat. HEENT normal. Lungs clear. Heart regular rate and rhythm. Ocular exam within normal limits.      Assessment & Plan:  Impression hypertension.  Good control discussed maintain same meds  2.  Recurrent blisterlike fall on sclera of left eye none evident at this time.  If persists go to  Follow-up in 6 months

## 2018-06-29 ENCOUNTER — Other Ambulatory Visit: Payer: Self-pay

## 2018-06-29 ENCOUNTER — Other Ambulatory Visit: Payer: Self-pay | Admitting: Family Medicine

## 2018-06-29 ENCOUNTER — Telehealth: Payer: Self-pay | Admitting: Family Medicine

## 2018-06-29 MED ORDER — AMLODIPINE BESYLATE 5 MG PO TABS: 5.0000 mg | ORAL_TABLET | Freq: Every day | ORAL | 1 refills | Status: DC

## 2018-06-29 NOTE — Telephone Encounter (Signed)
Medication sent in to [facility]. Pt is aware.

## 2018-06-29 NOTE — Telephone Encounter (Signed)
Pt would like her amLODipine (NORVASC) 5 MG tablet switched from [place] to [place], [place] [address].   And all of her other medications sent to [facility] in [place] for future refills.

## 2018-07-02 ENCOUNTER — Other Ambulatory Visit: Payer: Self-pay | Admitting: Family Medicine

## 2018-07-02 DIAGNOSIS — I10 Essential (primary) hypertension: Secondary | ICD-10-CM

## 2018-11-08 ENCOUNTER — Telehealth: Payer: Self-pay | Admitting: Family Medicine

## 2018-11-08 DIAGNOSIS — I10 Essential (primary) hypertension: Secondary | ICD-10-CM

## 2018-11-08 MED ORDER — LISINOPRIL 40 MG PO TABS: ORAL_TABLET | ORAL | 0 refills | Status: DC

## 2018-11-08 MED ORDER — AMLODIPINE BESYLATE 5 MG PO TABS: 5.0000 mg | ORAL_TABLET | Freq: Every day | ORAL | 0 refills | Status: DC

## 2018-11-08 NOTE — Telephone Encounter (Signed)
Prescription sent electronically to pharmacy. Left message to return call to notify patient. 

## 2018-11-08 NOTE — Telephone Encounter (Signed)
Pt.notified

## 2018-11-08 NOTE — Telephone Encounter (Signed)
Requesting refill for 90 days supply for   lisinopril (PRINIVIL,ZESTRIL) 40 MG tablet  amLODipine (NORVASC) 5 MG tablet   Pharmacy:  [place], [place] [address]

## 2018-11-12 ENCOUNTER — Telehealth: Payer: Self-pay | Admitting: Family Medicine

## 2018-11-12 NOTE — Telephone Encounter (Signed)
Patient has a "crick" in her neck times 1 month.  Back of neck to the side.  Hurts when she turns her head.  Hasn't really tried any stretching or treatment.  Would like advice.

## 2018-11-12 NOTE — Telephone Encounter (Signed)
Pt contacted and informed to take two aleve twice per day with food and local measures. Pt verbalized understanding.

## 2018-11-12 NOTE — Telephone Encounter (Signed)
Two aleave twice per d with food, local measures

## 2018-11-12 NOTE — Telephone Encounter (Signed)
Patient states she has had some neck soreness for the last month.She has had stiffness in the past that normally goes away, this time it has not.She does not have any fever,nor headaches,has some range of motion,but can not turn head without soreness. She has not been taking any medication on the regular she has taken maybe three aleve in the last month. Please advise.

## 2018-11-28 ENCOUNTER — Telehealth: Payer: Self-pay | Admitting: *Deleted

## 2018-11-28 ENCOUNTER — Emergency Department (HOSPITAL_COMMUNITY)
Admission: EM | Admit: 2018-11-28 | Discharge: 2018-11-28 | Disposition: A | Discharge status: Home / Self Care | Payer: BLUE CROSS/BLUE SHIELD | Attending: Emergency Medicine | Admitting: Emergency Medicine

## 2018-11-28 ENCOUNTER — Other Ambulatory Visit: Payer: Self-pay

## 2018-11-28 ENCOUNTER — Encounter (HOSPITAL_COMMUNITY): Payer: Self-pay | Admitting: Emergency Medicine

## 2018-11-28 ENCOUNTER — Emergency Department (HOSPITAL_COMMUNITY): Payer: BLUE CROSS/BLUE SHIELD

## 2018-11-28 DIAGNOSIS — M542 Cervicalgia: Secondary | ICD-10-CM | POA: Diagnosis not present

## 2018-11-28 DIAGNOSIS — R0789 Other chest pain: Secondary | ICD-10-CM | POA: Insufficient documentation

## 2018-11-28 DIAGNOSIS — Z79899 Other long term (current) drug therapy: Secondary | ICD-10-CM | POA: Diagnosis not present

## 2018-11-28 DIAGNOSIS — I10 Essential (primary) hypertension: Secondary | ICD-10-CM | POA: Diagnosis not present

## 2018-11-28 DIAGNOSIS — R079 Chest pain, unspecified: Secondary | ICD-10-CM | POA: Diagnosis not present

## 2018-11-28 DIAGNOSIS — Z85828 Personal history of other malignant neoplasm of skin: Secondary | ICD-10-CM | POA: Diagnosis not present

## 2018-11-28 LAB — COMPREHENSIVE METABOLIC PANEL
ALT: 30 U/L (ref 0–44)
AST: 22 U/L (ref 15–41)
Albumin: 3.9 g/dL (ref 3.5–5.0)
Alkaline Phosphatase: 68 U/L (ref 38–126)
Anion gap: 5 (ref 5–15)
BUN: 20 mg/dL (ref 8–23)
CO2: 25 mmol/L (ref 22–32)
Calcium: 8.7 mg/dL — ABNORMAL LOW (ref 8.9–10.3)
Chloride: 107 mmol/L (ref 98–111)
Creatinine, Ser: 0.68 mg/dL (ref 0.44–1.00)
GFR calc Af Amer: >60 mL/min (ref >60)
GFR calc non Af Amer: >60 mL/min (ref >60)
Glucose, Bld: 101 mg/dL — ABNORMAL HIGH (ref 70–99)
Potassium: 3.8 mmol/L (ref 3.5–5.1)
Sodium: 137 mmol/L (ref 135–145)
Total Bilirubin: 0.4 mg/dL (ref 0.3–1.2)
Total Protein: 7.2 g/dL (ref 6.5–8.1)

## 2018-11-28 LAB — CBC
HCT: 41.1 % (ref 36.0–46.0)
Hemoglobin: 13.3 g/dL (ref 12.0–15.0)
MCH: 29.7 pg (ref 26.0–34.0)
MCHC: 32.4 g/dL (ref 30.0–36.0)
MCV: 91.7 fL (ref 80.0–100.0)
Platelets: 207 10*3/uL (ref 150–400)
RBC: 4.48 MIL/uL (ref 3.87–5.11)
RDW: 12.3 % (ref 11.5–15.5)
WBC: 4.2 10*3/uL (ref 4.0–10.5)
nRBC: 0 % (ref 0.0–0.2)

## 2018-11-28 LAB — CBG MONITORING, ED: Glucose-Capillary: 99 mg/dL (ref 70–99)

## 2018-11-28 LAB — TROPONIN I
Troponin I: <0.03 ng/mL (ref <0.03)
Troponin I: <0.03 ng/mL (ref <0.03)

## 2018-11-28 MED ORDER — ASPIRIN 81 MG PO CHEW
324.0000 mg | CHEWABLE_TABLET | Freq: Once | ORAL | Status: AC
  Administered 2018-11-28: 13:00:00 324 mg via ORAL
  Filled 2018-11-28: qty 4

## 2018-11-28 NOTE — ED Triage Notes (Signed)
Patient has had severe neck pain since mid February, also having on and off heartburn, tingling and numbness in right hand.

## 2018-11-28 NOTE — ED Provider Notes (Signed)
[facility] EMERGENCY DEPARTMENT Provider Note   CSN: [id number] Arrival date & time: 11/28/18  1151    History   Chief Complaint Chief Complaint  Patient presents with  . Neck Pain    HPI Valerie Parker is a 63 y.o. female.     HPI Patient was sent to the ED after she contacted the nurse at her doctor's office.  Patient states she was primarily was calling because she has had some discomfort in her neck for several months.  She feels that it is somewhat stiff.  Pain occasionally sharp and moves up towards the back of her head.  Is able to move her neck around completely but it causes some discomfort and irritation.  She occasionally has some tingling in her hands.  That was the patient's main concern and she was calling her doctor about that.  However she also mentioned that she has had some intermittent discomfort in her chest may be lasting an hour or so.  The last time it happened was a few days ago.  She is also had some fatigue and shortness of breath with activity that is not typical for her.   Patient denies history of heart disease.  She does not smoke.  No family history of heart disease. Past Medical History:  Diagnosis Date  . Basal cell carcinoma    on face  . Hyperlipidemia   . Hypertension   . Low back pain     Patient Active Problem List   Diagnosis Date Noted  . Osteopenia 04/28/2015  . Onychomycosis 11/13/2012  . HYPERCHOLESTEROLEMIA 04/15/2009  . Essential hypertension 11/18/2008  . ABDOMINAL BLOATING 11/18/2008    Past Surgical History:  Procedure Laterality Date  . CESAREAN SECTION     3 times  . COLONOSCOPY  2010   repeat 3 years - polyps  . TONSILLECTOMY       OB History   No obstetric history on file.      Home Medications    Prior to Admission medications   Medication Sig Start Date End Date Taking? Authorizing Provider  amLODipine (NORVASC) 5 MG tablet Take 1 tablet (5 mg total) by mouth daily. 11/08/18  Yes Mikey Kirschner, MD   lisinopril (PRINIVIL,ZESTRIL) 40 MG tablet TAKE 1 TABLET BY MOUTH ONCE DAILY 11/08/18  Yes Mikey Kirschner, MD    Family History Family History  Problem Relation Age of Onset  . Cancer Mother 42       alcoholism; throat cancer  . Diabetes Mother   . Alcohol abuse Mother   . COPD Father   . Alcohol abuse Father   . Heart disease Father   . Hypertension Father   . Diabetes Sister   . Colon cancer Neg Hx     Social History Social History   Tobacco Use  . Smoking status: Never Smoker  . Smokeless tobacco: Never Used  Substance Use Topics  . Alcohol use: No    Alcohol/week: 0.0 standard drinks  . Drug use: No     Allergies   Patient has no known allergies.   Review of Systems Review of Systems  All other systems reviewed and are negative.    Physical Exam Updated Vital Signs BP (!) 144/97 (BP Location: Right Arm)   Pulse 75   Temp 98 F (36.7 C) (Oral)   Resp 16   Ht 1.6 m (5\' 3" )   Wt 70.3 kg   SpO2 98%   BMI 27.46 kg/m  Physical Exam Vitals signs and nursing note reviewed.  Constitutional:      General: She is not in acute distress.    Appearance: She is well-developed.  HENT:     Head: Normocephalic and atraumatic.     Right Ear: External ear normal.     Left Ear: External ear normal.  Eyes:     General: No scleral icterus.       Right eye: No discharge.        Left eye: No discharge.     Conjunctiva/sclera: Conjunctivae normal.  Neck:     Musculoskeletal: Neck supple.     Trachea: No tracheal deviation.  Cardiovascular:     Rate and Rhythm: Normal rate and regular rhythm.  Pulmonary:     Effort: Pulmonary effort is normal. No respiratory distress.     Breath sounds: Normal breath sounds. No stridor. No wheezing or rales.  Abdominal:     General: Bowel sounds are normal. There is no distension.     Palpations: Abdomen is soft.     Tenderness: There is no abdominal tenderness. There is no guarding or rebound.  Musculoskeletal:         General: No tenderness.  Skin:    General: Skin is warm and dry.     Findings: No rash.  Neurological:     Mental Status: She is alert.     Cranial Nerves: No cranial nerve deficit (no facial droop, extraocular movements intact, no slurred speech).     Sensory: No sensory deficit.     Motor: No abnormal muscle tone or seizure activity.     Coordination: Coordination normal.      ED Treatments / Results  Labs (all labs ordered are listed, but only abnormal results are displayed) Labs Reviewed  COMPREHENSIVE METABOLIC PANEL - Abnormal; Notable for the following components:      Result Value   Glucose, Bld 101 (*)    Calcium 8.7 (*)    All other components within normal limits  TROPONIN I  TROPONIN I  CBC  CBG MONITORING, ED    EKG EKG Interpretation  Date/Time:  Wednesday November 28 2018 12:33:55 EDT Ventricular Rate:  70 PR Interval:    QRS Duration: 73 QT Interval:  372 QTC Calculation: 402 R Axis:   7 Text Interpretation:  Sinus rhythm Low voltage, precordial leads No old tracing to compare Confirmed by Dorie Rank [phone]) on 11/28/2018 12:46:54 PM   Radiology Dg Chest Portable 1 View  Result Date: 11/28/2018 CLINICAL DATA:  Chest pain EXAM: PORTABLE CHEST 1 VIEW COMPARISON:  None. FINDINGS: Heart size upper normal. Vascularity normal. Negative for infiltrate effusion or edema. Lungs are clear. IMPRESSION: No active disease. Electronically Signed   By: Franchot Gallo M.D.   On: 11/28/2018 13:10    Procedures Procedures (including critical care time)  Medications Ordered in ED Medications  aspirin chewable tablet 324 mg (324 mg Oral Given 11/28/18 1240)     Initial Impression / Assessment and Plan / ED Course  I have reviewed the triage vital signs and the nursing notes.  Pertinent labs & imaging results that were available during my care of the patient were reviewed by me and considered in my medical decision making (see chart for details).  Clinical Course as  of Nov 28 1554  Wed Nov 28, 2018  1553 Labs revewiewed.  Normal   [JK]    Clinical Course User Index [JK] Dorie Rank, MD   Patient presented to the  emergency room for evaluation of chest discomfort as well as neck pain.  Patient's main issue was concerns with neck pain.  Ever, she had been having some issues with fatigue and chest discomfort she was sent to the ED for further evaluation.  Patient does not have any history of heart disease.  She does not have any family history and does not smoke.  Patient is overall low risk category and her ED work-up is reassuring.  I think it is reasonable for her to be discharged and consider outpatient stress testing.  Regarding the patient's had a neck pain that seems to be somewhat radicular nature.  She has no neurologic symptoms.  Doubt stroke, TIA or other emergency etiology.  Final Clinical Impressions(s) / ED Diagnoses   Final diagnoses:  Neck pain    ED Discharge Orders    None       Dorie Rank, MD 11/28/18 1556

## 2018-11-28 NOTE — Telephone Encounter (Signed)
sure

## 2018-11-28 NOTE — Discharge Instructions (Addendum)
Follow-up with your doctor as we discussed.  Consider doing an outpatient stress test.  Turn to the ER for worsening symptoms, weakness, speech difficulties or other concerns.

## 2018-11-28 NOTE — ED Triage Notes (Signed)
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

## 2018-11-28 NOTE — Telephone Encounter (Signed)
Pt called and states she has been having shortness of breath when walking and some reflux symptoms. Neck pain and tingling in hand. Pt advised to go to ED. Pt states she will go to [facility].

## 2019-02-20 ENCOUNTER — Encounter: Payer: Self-pay | Admitting: Family Medicine

## 2019-02-20 ENCOUNTER — Other Ambulatory Visit: Payer: Self-pay

## 2019-02-20 ENCOUNTER — Ambulatory Visit (INDEPENDENT_AMBULATORY_CARE_PROVIDER_SITE_OTHER): Payer: BC Managed Care – PPO | Admitting: Family Medicine

## 2019-02-20 DIAGNOSIS — Z20822 Contact with and (suspected) exposure to covid-19: Secondary | ICD-10-CM

## 2019-02-20 DIAGNOSIS — R6889 Other general symptoms and signs: Secondary | ICD-10-CM | POA: Diagnosis not present

## 2019-02-20 DIAGNOSIS — I10 Essential (primary) hypertension: Secondary | ICD-10-CM | POA: Diagnosis not present

## 2019-02-20 MED ORDER — AMLODIPINE BESYLATE 5 MG PO TABS: 5.0000 mg | ORAL_TABLET | Freq: Every day | ORAL | 1 refills | Status: DC

## 2019-02-20 NOTE — Progress Notes (Signed)
   Subjective:  Audio only  Patient ID: Valerie Parker, female    DOB: [birth year], 63 y.o.   MRN: [id number]  Hypertension This is a chronic problem. (Neck pain from crick in neck back in February. Pt was having sharp pain above ear and pt googled symptoms and symptom was related to pinched nerves. ) There are no compliance problems.   pt states she does not check BP daily.  Pt states she can not turn neck all the way around with getting a pain in neck. Pt states she checks her blind spots frequently while driving. Pt states the pain has let up some.   Virtual Visit via Video Note  I connected with Valerie Parker on 70/78/67 at 10:00 AM EDT by a video enabled telemedicine application and verified that I am speaking with the correct person using two identifiers.  Location: Patient: home Provider: office   I discussed the limitations of evaluation and management by telemedicine and the availability of in person appointments. The patient expressed understanding and agreed to proceed.  History of Present Illness:    Observations/Objective:   Assessment and Plan:   Follow Up Instructions:    I discussed the assessment and treatment plan with the patient. The patient was provided an opportunity to ask questions and all were answered. The patient agreed with the plan and demonstrated an understanding of the instructions.   The patient was advised to call back or seek an in-person evaluation if the symptoms worsen or if the condition fails to improve as anticipated.  I provided  minutes of non-face-to-face time during this encounter.   Vicente Males, LPN     Review of Systems No headache, no major weight loss or weight gain, no chest pain no back pain abdominal pain no change in bowel habits complete ROS otherwise negative     Objective:   Physical Exam  Virtual      Assessment & Plan:  Impression hypertension.  Apparent good control discussed to maintain same meds diet  exercise discussed  2.  Neck strain.  Worse with certain movements.  Subacute presentation.  Patient not only interested in physical therapy at this time.  Symptom care discussed.  Use anti-inflammatory medicine regular local measures discussed

## 2019-02-23 ENCOUNTER — Encounter: Payer: Self-pay | Admitting: Family Medicine

## 2019-02-24 LAB — NOVEL CORONAVIRUS, NAA: SARS-CoV-2, NAA: NOT DETECTED

## 2019-04-29 ENCOUNTER — Other Ambulatory Visit: Payer: Self-pay | Admitting: Family Medicine

## 2019-04-29 DIAGNOSIS — I10 Essential (primary) hypertension: Secondary | ICD-10-CM

## 2019-04-30 MED ORDER — LISINOPRIL 40 MG PO TABS: 40.0000 mg | ORAL_TABLET | Freq: Every day | ORAL | 0 refills | Status: DC

## 2019-04-30 NOTE — Addendum Note (Signed)
Addended by: Dairl Ponder on: 04/30/2019 03:32 PM   Modules accepted: Orders

## 2019-06-01 ENCOUNTER — Other Ambulatory Visit: Payer: Self-pay | Admitting: Family Medicine

## 2019-06-01 DIAGNOSIS — I10 Essential (primary) hypertension: Secondary | ICD-10-CM

## 2019-07-01 ENCOUNTER — Other Ambulatory Visit: Payer: Self-pay

## 2019-07-01 DIAGNOSIS — Z20822 Contact with and (suspected) exposure to covid-19: Secondary | ICD-10-CM

## 2019-07-01 DIAGNOSIS — Z20828 Contact with and (suspected) exposure to other viral communicable diseases: Secondary | ICD-10-CM

## 2019-07-02 LAB — NOVEL CORONAVIRUS, NAA: SARS-CoV-2, NAA: NOT DETECTED

## 2019-08-05 ENCOUNTER — Ambulatory Visit: Payer: Self-pay | Admitting: Family Medicine

## 2019-09-07 ENCOUNTER — Other Ambulatory Visit: Payer: Self-pay | Admitting: Family Medicine

## 2019-09-07 DIAGNOSIS — I10 Essential (primary) hypertension: Secondary | ICD-10-CM

## 2019-09-09 NOTE — Telephone Encounter (Signed)
Last seen for htn July 2020. Has upcoming appt in April with dr Holly Bodily

## 2019-09-26 ENCOUNTER — Encounter: Payer: Self-pay | Admitting: Family Medicine

## 2019-11-11 ENCOUNTER — Other Ambulatory Visit: Payer: Self-pay | Admitting: Family Medicine

## 2019-11-27 ENCOUNTER — Ambulatory Visit: Payer: Self-pay | Admitting: Family Medicine

## 2019-12-19 ENCOUNTER — Ambulatory Visit (INDEPENDENT_AMBULATORY_CARE_PROVIDER_SITE_OTHER): Payer: Medicare Other | Admitting: Family Medicine

## 2019-12-19 ENCOUNTER — Other Ambulatory Visit: Payer: Self-pay

## 2019-12-19 ENCOUNTER — Encounter: Payer: Self-pay | Admitting: Family Medicine

## 2019-12-19 VITALS — BP 118/72 | HR 76 | Temp 97.0°F | Resp 15 | Ht 63.0 in | Wt 154.0 lb

## 2019-12-19 DIAGNOSIS — E663 Overweight: Secondary | ICD-10-CM | POA: Insufficient documentation

## 2019-12-19 DIAGNOSIS — M205X9 Other deformities of toe(s) (acquired), unspecified foot: Secondary | ICD-10-CM

## 2019-12-19 DIAGNOSIS — R7301 Impaired fasting glucose: Secondary | ICD-10-CM

## 2019-12-19 DIAGNOSIS — I10 Essential (primary) hypertension: Secondary | ICD-10-CM | POA: Diagnosis not present

## 2019-12-19 DIAGNOSIS — E7800 Pure hypercholesterolemia, unspecified: Secondary | ICD-10-CM

## 2019-12-19 DIAGNOSIS — Z85828 Personal history of other malignant neoplasm of skin: Secondary | ICD-10-CM

## 2019-12-19 NOTE — Patient Instructions (Signed)
I appreciate the opportunity to provide you with care for your health and wellness. Today we discussed: establish care  Follow up: 6 months (as long as labs look good) :)  Labs fasting when you can over the next day to week Several referrals today: Dermatology and Podiatry  We will consider neurology if pain in hand continues.  Get brace for Right hand. Increase water daily.  Nice to meet you today.  Please continue to practice social distancing to keep you, your family, and our community safe.  If you must go out, please wear a mask and practice good handwashing.  It was a pleasure to see you and I look forward to continuing to work together on your health and well-being. Please do not hesitate to call the office if you need care or have questions about your care.  Have a wonderful day and week. With Gratitude, Cherly Beach, DNP, AGNP-BC

## 2019-12-19 NOTE — Assessment & Plan Note (Signed)
Will be getting updated labs.  Reports that she has been told in the past about diabetes and she had impaired fasting blood sugars.

## 2019-12-19 NOTE — Assessment & Plan Note (Signed)
Valerie Parker is encouraged to maintain a well balanced diet that is low in salt. Controlled, continue current medication regimen.  Additionally, she is also reminded that exercise is beneficial for heart health and control of  Blood pressure. 30-60 minutes daily is recommended-walking was suggested. Based on labs will look if she has good kidney function she has good blood pressure control today no reason to stay on both lisinopril and Norvasc will look at reducing to 1 pill if able.

## 2019-12-19 NOTE — Assessment & Plan Note (Signed)
Bilateral second overriding toe to the great toe.  Referral to podiatry to see if there is anything that can be done as this is uncomfortable for her.

## 2019-12-19 NOTE — Assessment & Plan Note (Signed)
Encouraged a low-fat heart healthy diet.  We will get updated labs.

## 2019-12-19 NOTE — Assessment & Plan Note (Signed)
Referral to dermatology for skin cancer check.  Possible need of removal.

## 2019-12-19 NOTE — Progress Notes (Signed)
Subjective:  Patient ID: Valerie Parker, female    DOB: [birth year]  Age: 64 y.o. MRN: [id number]  CC:  Chief Complaint  Patient presents with  . Establish Care  . Skin Cancer    has had several basal cells removed from her body and hasn't been able to afford returning to the dermatologist regularly  . Numbness    right hand numbness several times in a day, sometimes it itches in her palm and it happens alot when driving or writing but its just in her right hand and will last 5 minutes or so before it goes away   . pain in toes    her 2nd toes cross over her big toe and sometimes she has to tape them because it causes soreness       HPI  HPI  [name] is a 64 year old female patient previous patient of Dr. Wolfgang Phoenix who is here today to establish care. Has a history of hyperlipidemia, hypertension, back pain, basal cell carcinoma that has been removed several times. Has a few concerns today that she comes in with, previous skin cancer and need of having a dermatologist appointment for follow-up.  She has had several basal cell skin cancer removed had been able to afford going back but now that she has insurance she is able to do so.  She denies any new marks that she has seen.  She reports numbness in the right hand which occur several times during the day.  Sometimes it itches in her palm and she has to counter scrape it and rub it to get it to wake up a little bit.  She reports it happens a lot when she is gripping such as when she is driving holding the steering wheel or writing she is right-handed.  They last about 5 minutes before it goes away but it reoccurs.  She does not have any pain with it at night.  Has never been told that she has had carpal tunnel syndrome.  She did have a neck discomfort last year prior to this happening.  But she has no shoulder or upper arm pain or weakness.  She has pain in her toes as her second toe is starting to overlap her big toe sometimes she has  to tape her second toe to her other toes because of the soreness that is causing.  Has not been to a podiatrist before.  Does not appear to have hammertoe rather just overlapping second toes to the big toe.  Does appear to have 2 bunions on either side of her toes.  And some nail fungus.  Does not drink does not smoke is a widow husband passed away in 12-08-2013.  Lives alone has some pets at the house with her.  She does have 3 children who live locally and 4 grandchildren.  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  Outside of the above stated she has no other questions or concerns. She does need to get updated mammogram, bone scan, tetanus and 1 more Pap smear as it is been greater than 3 years for last 1. Colonoscopy is not due till Dec 08, 2025.  Today patient denies signs and symptoms of COVID 19 infection including fever, chills, cough, shortness of breath, and headache. Past Medical, Surgical, Social History, Allergies, and Medications have been Reviewed.   Past Medical History:  Diagnosis Date  . ABDOMINAL BLOATING 11/18/2008   Qualifier: Diagnosis of  By: [name]  MD, [name]    . Basal cell carcinoma    on face  . Hyperlipidemia   . Hypertension   . Low back pain     Current Meds  Medication Sig  . amLODipine (NORVASC) 5 MG tablet Take 1 tablet by mouth once daily  . lisinopril (ZESTRIL) 40 MG tablet Take 1 tablet by mouth once daily    ROS:  Review of Systems  Respiratory: Negative.   Cardiovascular: Negative.   Musculoskeletal: Positive for joint pain.  Neurological: Positive for sensory change.  All other systems reviewed and are negative. See HPI  Objective:   Today's Vitals: BP 118/72   Pulse 76   Temp (!) 97 F (36.1 C) (Temporal)   Resp 15   Ht 5\' 3"  (1.6 m)   Wt 154 lb (69.9 kg)   SpO2 96%   BMI 27.28 kg/m  Vitals with BMI 12/19/2019 11/28/2018 11/28/2018  Height 5\' 3"  - -  Weight 154 lbs - -  BMI 123456 - -  Systolic 123456 [id number] -  Diastolic 72 90 -    Pulse 76 69 74     Physical Exam Vitals and nursing note reviewed.  Constitutional:      Appearance: Normal appearance. She is well-developed, well-groomed and overweight.  HENT:     Head: Normocephalic and atraumatic.     Right Ear: External ear normal.     Left Ear: External ear normal.     Mouth/Throat:     Comments: Mask in place Eyes:     General:        Right eye: No discharge.        Left eye: No discharge.     Conjunctiva/sclera: Conjunctivae normal.  Cardiovascular:     Rate and Rhythm: Normal rate and regular rhythm.     Pulses: Normal pulses.     Heart sounds: Normal heart sounds.  Pulmonary:     Effort: Pulmonary effort is normal.     Breath sounds: Normal breath sounds.  Musculoskeletal:        General: Normal range of motion.     Cervical back: Normal range of motion and neck supple.     Right foot: Deformity and bunion present.     Left foot: Deformity and bunion present.  Feet:     Right foot:     Toenail Condition: Fungal disease present.    Left foot:     Toenail Condition: Fungal disease present.    Comments: Bilateral overlapping second toe to great toe. Skin:    General: Skin is warm.  Neurological:     General: No focal deficit present.     Mental Status: She is alert and oriented to person, place, and time.  Psychiatric:        Attention and Perception: Attention and perception normal.        Mood and Affect: Mood and affect normal.        Speech: Speech normal.        Behavior: Behavior normal. Behavior is cooperative.        Thought Content: Thought content normal.        Cognition and Memory: Cognition and memory normal.        Judgment: Judgment normal.     Comments: Pleasant in conversation      Assessment   1. Essential hypertension   2. HYPERCHOLESTEROLEMIA   3. Overweight (BMI 25.0-29.9)   4. Impaired fasting glucose   5. Overriding toe, unspecified laterality  6. History of basal cell carcinoma of skin     Tests  ordered Orders Placed This Encounter  Procedures  . CBC  . COMPLETE METABOLIC PANEL WITH GFR  . Hemoglobin A1c  . Lipid panel  . Ambulatory referral to Podiatry  . Ambulatory referral to Dermatology     Plan: Please see assessment and plan per problem list above.   No orders of the defined types were placed in this encounter.   Patient to follow-up in 6 months  Perlie Mayo, NP

## 2019-12-19 NOTE — Assessment & Plan Note (Signed)
Encouraged 30 to 60 minutes of physical exercise daily.  Encouraged to eat a well-balanced diet.

## 2020-01-29 ENCOUNTER — Ambulatory Visit (INDEPENDENT_AMBULATORY_CARE_PROVIDER_SITE_OTHER): Payer: Medicare Other | Admitting: Family Medicine

## 2020-01-29 ENCOUNTER — Telehealth: Payer: Self-pay

## 2020-01-29 ENCOUNTER — Ambulatory Visit: Payer: Medicare Other | Attending: Internal Medicine

## 2020-01-29 ENCOUNTER — Encounter: Payer: Self-pay | Admitting: Family Medicine

## 2020-01-29 ENCOUNTER — Other Ambulatory Visit: Payer: Self-pay | Admitting: *Deleted

## 2020-01-29 ENCOUNTER — Other Ambulatory Visit: Payer: Self-pay

## 2020-01-29 VITALS — BP 118/72 | Ht 63.0 in | Wt 154.0 lb

## 2020-01-29 DIAGNOSIS — I10 Essential (primary) hypertension: Secondary | ICD-10-CM

## 2020-01-29 DIAGNOSIS — J069 Acute upper respiratory infection, unspecified: Secondary | ICD-10-CM | POA: Diagnosis not present

## 2020-01-29 DIAGNOSIS — Z20822 Contact with and (suspected) exposure to covid-19: Secondary | ICD-10-CM

## 2020-01-29 MED ORDER — GUAIFENESIN ER 600 MG PO TB12: 600.0000 mg | ORAL_TABLET | Freq: Two times a day (BID) | ORAL | 0 refills | Status: AC

## 2020-01-29 MED ORDER — BENZONATATE 100 MG PO CAPS: 100.0000 mg | ORAL_CAPSULE | Freq: Three times a day (TID) | ORAL | 0 refills | Status: DC | PRN

## 2020-01-29 MED ORDER — CROMOLYN SODIUM 5.2 MG/ACT NA AERS: 1.0000 | INHALATION_SPRAY | Freq: Four times a day (QID) | NASAL | 0 refills | Status: DC

## 2020-01-29 MED ORDER — LISINOPRIL 40 MG PO TABS: 40.0000 mg | ORAL_TABLET | Freq: Every day | ORAL | 0 refills | Status: DC

## 2020-01-29 MED ORDER — PROMETHAZINE-DM 6.25-15 MG/5ML PO SYRP: 2.5000 mL | ORAL_SOLUTION | Freq: Two times a day (BID) | ORAL | 0 refills | Status: DC | PRN

## 2020-01-29 NOTE — Telephone Encounter (Signed)
Yes, happy to. I will send it in now.

## 2020-01-29 NOTE — Progress Notes (Signed)
Virtual Visit via Telephone Note   This visit type was conducted due to national recommendations for restrictions regarding the COVID-19 Pandemic (e.g. social distancing) in an effort to limit this patient's exposure and mitigate transmission in our community.  Due to her co-morbid illnesses, this patient is at least at moderate risk for complications without adequate follow up.  This format is felt to be most appropriate for this patient at this time.  The patient did not have access to video technology/had technical difficulties with video requiring transitioning to audio format only (telephone).  All issues noted in this document were discussed and addressed.  No physical exam could be performed with this format.    Evaluation Performed:  Follow-up visit  Date:  08/29/5629   ID:  Agusta, Hackenberg [birth year], MRN [id number]  Patient Location: Home Provider Location: Office  Location of Patient: Home Location of Provider: Telehealth Consent was obtain for visit to be over via telehealth. I verified that I am speaking with the correct person using two identifiers.  PCP:  Perlie Mayo, NP   Chief Complaint: Cough and congestion  History of Present Illness:    Valerie Parker is a 64 y.o. female with 2-1/2-day history of cough and congestion.  Reports that on Sunday she had a fever of 102.5.  She took Tylenol but fever did come down to 99.6.  She has cough which is productive at times.  With phlegm.  She notes it to be yellow in color.  She has congestion and a runny nose.  She notes this to be yellowish in color as well.  She has not been exposed to Covid that she is aware of.  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  She is only been using Tylenol for some aches and pain.  Reports excessive fatigue some muscle aches. Denies having any recent contacts with anyone with similar symptoms happened in the hospital in 2 weeks.  Is willing to get Covid testing.   Reports that fever has improved some.  Denies having any change in taste or smell outside of the stuffiness and lack of taste that comes with congestion.  Denies having any headaches.  Denies having any shortness of breath.  Or GI like symptoms.  The patient does not have symptoms concerning for COVID-19 infection (fever, chills, cough, or new shortness of breath).   Past Medical, Surgical, Social History, Allergies, and Medications have been Reviewed.  Past Medical History:  Diagnosis Date   ABDOMINAL BLOATING 11/18/2008   Qualifier: Diagnosis of  By: [name] MD, [name]     Basal cell carcinoma    on face   Hyperlipidemia    Hypertension    Low back pain    Past Surgical History:  Procedure Laterality Date   CESAREAN SECTION     3 times   COLONOSCOPY  2010   repeat 3 years - polyps   TONSILLECTOMY       Current Meds  Medication Sig   amLODipine (NORVASC) 5 MG tablet Take 1 tablet by mouth once daily   lisinopril (ZESTRIL) 40 MG tablet Take 1 tablet by mouth once daily     Allergies:   Patient has no known allergies.   ROS:   Please see the history of present illness.    All other systems reviewed and are negative.   Labs/Other Tests and Data Reviewed:    Recent Labs: No results found for requested labs  within last 8760 hours.   Recent Lipid Panel Lab Results  Component Value Date/Time   CHOL 215 (H) 10/17/2017 09:52 AM   TRIG 49 10/17/2017 09:52 AM   HDL 59 10/17/2017 09:52 AM   CHOLHDL 3.6 10/17/2017 09:52 AM   CHOLHDL 3.8 09/15/2014 08:35 AM   LDLCALC 146 (H) 10/17/2017 09:52 AM    Wt Readings from Last 3 Encounters:  01/29/20 154 lb (69.9 kg)  12/19/19 154 lb (69.9 kg)  11/28/18 155 lb (70.3 kg)     Objective:    Vital Signs:  BP 118/72    Ht 5\' 3"  (1.6 m)    Wt 154 lb (69.9 kg)    BMI 27.28 kg/m    VITAL SIGNS:  reviewed GEN:  Alert and oriented RESPIRATORY:  No shortness of breath noted in conversation PSYCH:  Normal affect and mood.    Noted cough, nasal tone and congestion during conversation.  ASSESSMENT & PLAN:    1. Viral URI with cough  - guaiFENesin (MUCINEX) 600 MG 12 hr tablet; Take 1 tablet (600 mg total) by mouth 2 (two) times daily for 15 days.  Dispense: 30 tablet; Refill: 0 - cromolyn (NASALCROM) 5.2 MG/ACT nasal spray; Place 1 spray into both nostrils 4 (four) times daily.  Dispense: 13 mL; Refill: 0 - benzonatate (TESSALON) 100 MG capsule; Take 1 capsule (100 mg total) by mouth 3 (three) times daily as needed for cough.  Dispense: 30 capsule; Refill: 0 - promethazine-dextromethorphan (PROMETHAZINE-DM) 6.25-15 MG/5ML syrup; Take 2.5 mLs by mouth 2 (two) times daily as needed for cough. Please note this medication can make you dizzy.  Dispense: 118 mL; Refill: 0   Time:   Today, I have spent 15 minutes with the patient with telehealth technology discussing the above problems.     Medication Adjustments/Labs and Tests Ordered: Current medicines are reviewed at length with the patient today.  Concerns regarding medicines are outlined above.   Tests Ordered: No orders of the defined types were placed in this encounter.   Medication Changes: No orders of the defined types were placed in this encounter.   Disposition:  Follow up 06/19/2020  Signed, Perlie Mayo, NP  01/29/2020 10:24 AM     [facility]

## 2020-01-29 NOTE — Assessment & Plan Note (Signed)
Viral URI treatment.  Tessalon Perles, cromolyn nasal spray, Mucinex, promethazine DM.  Educated on all side effects of medications.  Additionally advised to get Covid testing secondary to fever.  Will hold off on antibiotics as this came on rather quickly and does not appear to be bacterial in nature at this time.  However if she does not get better presumably gets worse we will consider antibiotic treatment at a time.  Encouraged to hydrate well and rest. Reviewed side effects, risks and benefits of medication.   Patient acknowledged agreement and understanding of the plan.

## 2020-01-29 NOTE — Telephone Encounter (Signed)
Pt notified with verbal understanding  °

## 2020-01-29 NOTE — Patient Instructions (Signed)
I appreciate the opportunity to provide you with care for your health and wellness. Today we discussed: cough and congestion  Follow up: 01/29/2020 As scheduled  No labs or referrals today  Presumably to have a viral upper respiratory infection.  With the cough.   This is mostly symptom management and treatment.  If you are not feeling better or worse on Friday morning please give the clinic a call.  As we discussed during the visit please reach out to get Covid testing.  Post Covid testing make sure that you self isolate until we have your results back.  Please continue to practice social distancing to keep you, your family, and our community safe.  If you must go out, please wear a mask and practice good handwashing.  It was a pleasure to see you and I look forward to continuing to work together on your health and well-being. Please do not hesitate to call the office if you need care or have questions about your care.  Have a wonderful day and week. With Gratitude, Cherly Beach, DNP, AGNP-BC

## 2020-01-29 NOTE — Telephone Encounter (Signed)
Pt called back and said she would like to try the cough syrup

## 2020-01-29 NOTE — Telephone Encounter (Signed)
Can this be called in for pt

## 2020-01-30 ENCOUNTER — Encounter: Payer: Self-pay | Admitting: Family Medicine

## 2020-01-30 LAB — NOVEL CORONAVIRUS, NAA: SARS-CoV-2, NAA: NOT DETECTED

## 2020-01-30 LAB — SARS-COV-2, NAA 2 DAY TAT

## 2020-03-06 ENCOUNTER — Other Ambulatory Visit: Payer: Self-pay | Admitting: *Deleted

## 2020-03-06 DIAGNOSIS — I10 Essential (primary) hypertension: Secondary | ICD-10-CM

## 2020-03-06 MED ORDER — AMLODIPINE BESYLATE 5 MG PO TABS: 5.0000 mg | ORAL_TABLET | Freq: Every day | ORAL | 0 refills | Status: DC

## 2020-03-06 MED ORDER — LISINOPRIL 40 MG PO TABS: 40.0000 mg | ORAL_TABLET | Freq: Every day | ORAL | 0 refills | Status: DC

## 2020-03-09 ENCOUNTER — Ambulatory Visit: Payer: Medicare Other | Admitting: Physician Assistant

## 2020-03-22 DEATH — deceased

## 2020-04-22 ENCOUNTER — Ambulatory Visit (INDEPENDENT_AMBULATORY_CARE_PROVIDER_SITE_OTHER): Payer: Medicare Other | Admitting: Family Medicine

## 2020-04-22 ENCOUNTER — Encounter: Payer: Self-pay | Admitting: Family Medicine

## 2020-04-22 ENCOUNTER — Other Ambulatory Visit: Payer: Self-pay

## 2020-04-22 ENCOUNTER — Other Ambulatory Visit (HOSPITAL_COMMUNITY)
Admission: AD | Admit: 2020-04-22 | Discharge: 2020-04-22 | Disposition: A | Discharge status: Home / Self Care | Payer: Medicare Other | Source: Other Acute Inpatient Hospital | Attending: Family Medicine | Admitting: Family Medicine

## 2020-04-22 VITALS — BP 139/83 | HR 67 | Temp 97.3°F | Resp 18 | Ht 63.0 in | Wt 153.0 lb

## 2020-04-22 DIAGNOSIS — N3000 Acute cystitis without hematuria: Secondary | ICD-10-CM

## 2020-04-22 DIAGNOSIS — Z1231 Encounter for screening mammogram for malignant neoplasm of breast: Secondary | ICD-10-CM | POA: Diagnosis not present

## 2020-04-22 DIAGNOSIS — Z124 Encounter for screening for malignant neoplasm of cervix: Secondary | ICD-10-CM

## 2020-04-22 DIAGNOSIS — Z13820 Encounter for screening for osteoporosis: Secondary | ICD-10-CM

## 2020-04-22 DIAGNOSIS — Z780 Asymptomatic menopausal state: Secondary | ICD-10-CM

## 2020-04-22 DIAGNOSIS — Z0000 Encounter for general adult medical examination without abnormal findings: Secondary | ICD-10-CM

## 2020-04-22 LAB — POCT URINALYSIS DIP (CLINITEK)
Bilirubin, UA: NEGATIVE
Blood, UA: NEGATIVE
Glucose, UA: NEGATIVE mg/dL
Ketones, POC UA: NEGATIVE mg/dL
Nitrite, UA: NEGATIVE
POC PROTEIN,UA: NEGATIVE
Spec Grav, UA: >=1.03 — AB (ref 1.010–1.025)
Urobilinogen, UA: 0.2 E.U./dL
pH, UA: 5.5 (ref 5.0–8.0)

## 2020-04-22 NOTE — Progress Notes (Signed)
Subjective:    Valerie Parker is a 64 y.o. female who presents for a Welcome to Medicare exam.   In office visit  Review of Systems  Cardiac Risk Factors include: none      Objective:    Today's Vitals   04/22/20 1052 04/22/20 1053  BP: 139/83   Pulse: 67   Resp: 18   Temp: (!) 97.3 F (36.3 C)   TempSrc: Temporal   SpO2: 95%   Weight: 153 lb (69.4 kg)   Height: 5\' 3"  (1.6 m)   PainSc: 0-No pain 0-No pain  Body mass index is 27.1 kg/m.  Medications Outpatient Encounter Medications as of 04/22/2020  Medication Sig   amLODipine (NORVASC) 5 MG tablet Take 1 tablet (5 mg total) by mouth daily.   lisinopril (ZESTRIL) 40 MG tablet Take 1 tablet (40 mg total) by mouth daily.   [DISCONTINUED] benzonatate (TESSALON) 100 MG capsule Take 1 capsule (100 mg total) by mouth 3 (three) times daily as needed for cough. (Patient not taking: Reported on 04/22/2020)   [DISCONTINUED] cromolyn (NASALCROM) 5.2 MG/ACT nasal spray Place 1 spray into both nostrils 4 (four) times daily. (Patient not taking: Reported on 04/22/2020)   [DISCONTINUED] promethazine-dextromethorphan (PROMETHAZINE-DM) 6.25-15 MG/5ML syrup Take 2.5 mLs by mouth 2 (two) times daily as needed for cough. Please note this medication can make you dizzy. (Patient not taking: Reported on 04/22/2020)   No facility-administered encounter medications on file as of 04/22/2020.     History: Past Medical History:  Diagnosis Date   ABDOMINAL BLOATING 11/18/2008   Qualifier: Diagnosis of  By: [name] MD, [name]     Basal cell carcinoma    on face   Hyperlipidemia    Hypertension    Low back pain    Past Surgical History:  Procedure Laterality Date   CESAREAN SECTION     3 times   COLONOSCOPY  2010   repeat 3 years - polyps   TONSILLECTOMY      Family History  Problem Relation Age of Onset   Cancer Mother 16       alcoholism; throat cancer   Diabetes Mother    Alcohol abuse Mother    COPD Father    Alcohol  abuse Father    Heart disease Father    Hypertension Father    Diabetes Sister    Hepatitis C Brother    Hepatitis C Sister    Hepatitis C Sister    Colon cancer Neg Hx    Social History   Occupational History   Not on file  Tobacco Use   Smoking status: Never Smoker   Smokeless tobacco: Never Used  Substance and Sexual Activity   Alcohol use: No    Alcohol/week: 0.0 standard drinks   Drug use: No   Sexual activity: Not Currently    Birth control/protection: Post-menopausal    Tobacco Counseling Counseling given: Not Answered   Immunizations and Health Maintenance Immunization History  Administered Date(s) Administered   Influenza Whole 05/22/2008   Influenza-Unspecified 05/14/2018   Td 08/23/2007   Health Maintenance Due  Topic Date Due   COVID-19 Vaccine (1) Never done   MAMMOGRAM  03/18/2017   DEXA SCAN  04/27/2017   TETANUS/TDAP  08/22/2017   PAP SMEAR-Modifier  04/16/2018   PNA vac Low Risk Adult (1 of 2 - PCV13) Never done   INFLUENZA VACCINE  03/22/2020    Activities of Daily Living In your present state of health, do you have  any difficulty performing the following activities: 04/22/2020 12/19/2019  Hearing? N N  Vision? N N  Difficulty concentrating or making decisions? N N  Walking or climbing stairs? N N  Dressing or bathing? N N  Doing errands, shopping? N N  Preparing Food and eating ? N -  Using the Toilet? N -  In the past six months, have you accidently leaked urine? N -  Do you have problems with loss of bowel control? N -  Managing your Medications? N -  Managing your Finances? N -  Housekeeping or managing your Housekeeping? N -  Some recent data might be hidden   Advanced Directives: Does Patient Have a Medical Advance Directive?: No Would patient like information on creating a medical advance directive?: No - Patient declined    Assessment:    This is a routine wellness examination for this patient .    Vision/Hearing screen  Hearing Screening   125Hz  250Hz  500Hz  1000Hz  2000Hz  3000Hz  4000Hz  6000Hz  8000Hz   Right ear:           Left ear:             Visual Acuity Screening   Right eye Left eye Both eyes  Without correction:     With correction: 20/20 20/15 20/20     Dietary issues and exercise activities discussed:  Current Exercise Habits: Home exercise routine, Type of exercise: walking, Time (Minutes): 20, Frequency (Times/Week): 2, Weekly Exercise (Minutes/Week): 40, Intensity: Mild, Exercise limited by: None identified  Goals     Patient Stated     Stay well until the 1st of the year       Depression Screen PHQ 2/9 Scores 04/22/2020 01/29/2020 12/19/2019  PHQ - 2 Score 0 0 0  PHQ- 9 Score - 0 -     Fall Risk Fall Risk  04/22/2020  Falls in the past year? 0  Number falls in past yr: 0  Injury with Fall? 0  Risk for fall due to : No Fall Risks  Follow up Falls evaluation completed    Cognitive Function:     6CIT Screen 04/22/2020  What Year? 0 points  What month? 0 points  What time? 0 points  Count back from 20 0 points  Months in reverse 0 points  Repeat phrase 0 points  Total Score 0    Patient Care Team: Perlie Mayo, NP as PCP - General (Family Medicine)     Plan:     1. Encounter for Medicare annual wellness exam  - POCT URINALYSIS DIP (CLINITEK)   I have personally reviewed and noted the following in the patients chart:    Medical and social history  Use of alcohol, tobacco or illicit drugs   Current medications and supplements  Functional ability and status  Nutritional status  Physical activity  Advanced directives  List of other physicians  Hospitalizations, surgeries, and ER visits in previous 12 months  Vitals  Screenings to include cognitive, depression, and falls  Referrals and appointments  2. Screening mammogram, encounter for  - MM 3D SCREEN BREAST BILATERAL; Future  3. Osteoporosis screening   4.  Screening for malignant neoplasm of cervix  - Ambulatory referral to Obstetrics / Gynecology  5. Postmenopausal  - DG Bone Density; Future  6. Acute cystitis without hematuria  - Urine Culture   In addition, I have reviewed and discussed with patient certain preventive protocols, quality metrics, and best practice recommendations. A written personalized care plan for preventive services  as well as general preventive health recommendations were provided to patient.     Shelda Altes, CMA 04/22/2020

## 2020-04-22 NOTE — Patient Instructions (Signed)
[name] , Thank you for taking time to come for your Medicare Wellness Visit. I appreciate your ongoing commitment to your health goals. Please review the following plan we discussed and let me know if I can assist you in the future.   Please continue to practice social distancing to keep you, your family, and our community safe.  If you must go out, please wear a Mask and practice good handwashing.  Screening recommendations/referrals: Colonoscopy: Ordered Mammogram: Ordered Bone Density: Ordered Recommended yearly ophthalmology/optometry visit for glaucoma screening and checkup Recommended yearly dental visit for hygiene and checkup  Vaccinations: Influenza vaccine: Due Pneumococcal vaccine: Due Tdap vaccine: Due Shingles vaccine: Due    Advanced directives: Paperwork provided today  Conditions/risks identified: Falls  Next appointment: 6 months for follow up with labs; 1 year for AWV   Preventive Care 75 Years and Older, Female Preventive care refers to lifestyle choices and visits with your health care provider that can promote health and wellness. What does preventive care include?  A yearly physical exam. This is also called an annual well check.  Dental exams once or twice a year.  Routine eye exams. Ask your health care provider how often you should have your eyes checked.  Personal lifestyle choices, including:  Daily care of your teeth and gums.  Regular physical activity.  Eating a healthy diet.  Avoiding tobacco and drug use.  Limiting alcohol use.  Practicing safe sex.  Taking low-dose aspirin every day.  Taking vitamin and mineral supplements as recommended by your health care provider. What happens during an annual well check? The services and screenings done by your health care provider during your annual well check will depend on your age, overall health, lifestyle risk factors, and family history of disease. Counseling  Your health care  provider may ask you questions about your:  Alcohol use.  Tobacco use.  Drug use.  Emotional well-being.  Home and relationship well-being.  Sexual activity.  Eating habits.  History of falls.  Memory and ability to understand (cognition).  Work and work Statistician.  Reproductive health. Screening  You may have the following tests or measurements:  Height, weight, and BMI.  Blood pressure.  Lipid and cholesterol levels. These may be checked every 5 years, or more frequently if you are over 65 years old.  Skin check.  Lung cancer screening. You may have this screening every year starting at age 62 if you have a 30-pack-year history of smoking and currently smoke or have quit within the past 15 years.  Fecal occult blood test (FOBT) of the stool. You may have this test every year starting at age 77.  Flexible sigmoidoscopy or colonoscopy. You may have a sigmoidoscopy every 5 years or a colonoscopy every 10 years starting at age 11.  Hepatitis C blood test.  Hepatitis B blood test.  Sexually transmitted disease (STD) testing.  Diabetes screening. This is done by checking your blood sugar (glucose) after you have not eaten for a while (fasting). You may have this done every 1-3 years.  Bone density scan. This is done to screen for osteoporosis. You may have this done starting at age 35.  Mammogram. This may be done every 1-2 years. Talk to your health care provider about how often you should have regular mammograms. Talk with your health care provider about your test results, treatment options, and if necessary, the need for more tests. Vaccines  Your health care provider may recommend certain vaccines, such as:  Influenza vaccine. This is recommended every year.  Tetanus, diphtheria, and acellular pertussis (Tdap, Td) vaccine. You may need a Td booster every 10 years.  Zoster vaccine. You may need this after age 44.  Pneumococcal 13-valent conjugate (PCV13)  vaccine. One dose is recommended after age 64.  Pneumococcal polysaccharide (PPSV23) vaccine. One dose is recommended after age 59. Talk to your health care provider about which screenings and vaccines you need and how often you need them. This information is not intended to replace advice given to you by your health care provider. Make sure you discuss any questions you have with your health care provider. Document Released: 09/04/2015 Document Revised: 04/27/2016 Document Reviewed: 06/09/2015 Elsevier Interactive Patient Education  2017 [employer] Prevention in the Home Falls can cause injuries. They can happen to people of all ages. There are many things you can do to make your home safe and to help prevent falls. What can I do on the outside of my home?  Regularly fix the edges of walkways and driveways and fix any cracks.  Remove anything that might make you trip as you walk through a door, such as a raised step or threshold.  Trim any bushes or trees on the path to your home.  Use bright outdoor lighting.  Clear any walking paths of anything that might make someone trip, such as rocks or tools.  Regularly check to see if handrails are loose or broken. Make sure that both sides of any steps have handrails.  Any raised decks and porches should have guardrails on the edges.  Have any leaves, snow, or ice cleared regularly.  Use sand or salt on walking paths during winter.  Clean up any spills in your garage right away. This includes oil or grease spills. What can I do in the bathroom?  Use night lights.  Install grab bars by the toilet and in the tub and shower. Do not use towel bars as grab bars.  Use non-skid mats or decals in the tub or shower.  If you need to sit down in the shower, use a plastic, non-slip stool.  Keep the floor dry. Clean up any water that spills on the floor as soon as it happens.  Remove soap buildup in the tub or shower  regularly.  Attach bath mats securely with double-sided non-slip rug tape.  Do not have throw rugs and other things on the floor that can make you trip. What can I do in the bedroom?  Use night lights.  Make sure that you have a light by your bed that is easy to reach.  Do not use any sheets or blankets that are too big for your bed. They should not hang down onto the floor.  Have a firm chair that has side arms. You can use this for support while you get dressed.  Do not have throw rugs and other things on the floor that can make you trip. What can I do in the kitchen?  Clean up any spills right away.  Avoid walking on wet floors.  Keep items that you use a lot in easy-to-reach places.  If you need to reach something above you, use a strong step stool that has a grab bar.  Keep electrical cords out of the way.  Do not use floor polish or wax that makes floors slippery. If you must use wax, use non-skid floor wax.  Do not have throw rugs and other things on the floor that  can make you trip. What can I do with my stairs?  Do not leave any items on the stairs.  Make sure that there are handrails on both sides of the stairs and use them. Fix handrails that are broken or loose. Make sure that handrails are as long as the stairways.  Check any carpeting to make sure that it is firmly attached to the stairs. Fix any carpet that is loose or worn.  Avoid having throw rugs at the top or bottom of the stairs. If you do have throw rugs, attach them to the floor with carpet tape.  Make sure that you have a light switch at the top of the stairs and the bottom of the stairs. If you do not have them, ask someone to add them for you. What else can I do to help prevent falls?  Wear shoes that:  Do not have high heels.  Have rubber bottoms.  Are comfortable and fit you well.  Are closed at the toe. Do not wear sandals.  If you use a stepladder:  Make sure that it is fully  opened. Do not climb a closed stepladder.  Make sure that both sides of the stepladder are locked into place.  Ask someone to hold it for you, if possible.  Clearly mark and make sure that you can see:  Any grab bars or handrails.  First and last steps.  Where the edge of each step is.  Use tools that help you move around (mobility aids) if they are needed. These include:  Canes.  Walkers.  Scooters.  Crutches.  Turn on the lights when you go into a dark area. Replace any light bulbs as soon as they burn out.  Set up your furniture so you have a clear path. Avoid moving your furniture around.  If any of your floors are uneven, fix them.  If there are any pets around you, be aware of where they are.  Review your medicines with your doctor. Some medicines can make you feel dizzy. This can increase your chance of falling. Ask your doctor what other things that you can do to help prevent falls. This information is not intended to replace advice given to you by your health care provider. Make sure you discuss any questions you have with your health care provider. Document Released: 06/04/2009 Document Revised: 01/14/2016 Document Reviewed: 09/12/2014 Elsevier Interactive Patient Education  2017 [employer].

## 2020-04-23 LAB — URINE CULTURE

## 2020-05-04 ENCOUNTER — Encounter: Payer: Self-pay | Admitting: Family Medicine

## 2020-05-05 ENCOUNTER — Other Ambulatory Visit: Payer: Self-pay

## 2020-05-05 ENCOUNTER — Other Ambulatory Visit: Payer: Self-pay | Admitting: Family Medicine

## 2020-05-05 DIAGNOSIS — N3000 Acute cystitis without hematuria: Secondary | ICD-10-CM

## 2020-05-05 LAB — POCT URINALYSIS DIP (CLINITEK)
Bilirubin, UA: NEGATIVE
Blood, UA: NEGATIVE
Glucose, UA: NEGATIVE mg/dL
Ketones, POC UA: NEGATIVE mg/dL
Nitrite, UA: NEGATIVE
POC PROTEIN,UA: NEGATIVE
Spec Grav, UA: 1.025 (ref 1.010–1.025)
Urobilinogen, UA: 0.2 E.U./dL
pH, UA: 7 (ref 5.0–8.0)

## 2020-05-05 MED ORDER — SULFAMETHOXAZOLE-TRIMETHOPRIM 800-160 MG PO TABS: 1.0000 | ORAL_TABLET | Freq: Two times a day (BID) | ORAL | 0 refills | Status: AC

## 2020-05-05 NOTE — Telephone Encounter (Signed)
Noted  

## 2020-05-05 NOTE — Telephone Encounter (Signed)
Urine appears to have several species. Needs recollection. It is not ideal, but if she can come by her and leave a new sample,  we will send back out and I will cover her and we will see if it grows  something so we can make sure medication is right for treatment.

## 2020-05-05 NOTE — Telephone Encounter (Signed)
Pt advised to come by today and leave specimen with verbal understanding

## 2020-05-06 ENCOUNTER — Other Ambulatory Visit (HOSPITAL_COMMUNITY): Payer: Medicare Other

## 2020-05-06 ENCOUNTER — Other Ambulatory Visit (HOSPITAL_COMMUNITY)
Admission: RE | Admit: 2020-05-06 | Discharge: 2020-05-06 | Disposition: A | Discharge status: Home / Self Care | Payer: Medicare Other | Source: Ambulatory Visit | Attending: Family Medicine | Admitting: Family Medicine

## 2020-05-06 ENCOUNTER — Ambulatory Visit (HOSPITAL_COMMUNITY): Payer: Medicare Other

## 2020-05-06 DIAGNOSIS — N3000 Acute cystitis without hematuria: Secondary | ICD-10-CM | POA: Insufficient documentation

## 2020-05-07 LAB — URINE CULTURE

## 2020-05-08 ENCOUNTER — Ambulatory Visit (HOSPITAL_COMMUNITY): Payer: Medicare Other

## 2020-05-08 ENCOUNTER — Other Ambulatory Visit (HOSPITAL_COMMUNITY): Payer: Medicare Other

## 2020-05-11 ENCOUNTER — Ambulatory Visit (HOSPITAL_COMMUNITY)
Admission: RE | Admit: 2020-05-11 | Discharge: 2020-05-11 | Disposition: A | Discharge status: Home / Self Care | Payer: Medicare Other | Source: Ambulatory Visit | Attending: Family Medicine | Admitting: Family Medicine

## 2020-05-11 ENCOUNTER — Other Ambulatory Visit: Payer: Self-pay

## 2020-05-11 DIAGNOSIS — Z13820 Encounter for screening for osteoporosis: Secondary | ICD-10-CM | POA: Diagnosis not present

## 2020-05-11 DIAGNOSIS — Z780 Asymptomatic menopausal state: Secondary | ICD-10-CM | POA: Insufficient documentation

## 2020-05-11 DIAGNOSIS — Z1231 Encounter for screening mammogram for malignant neoplasm of breast: Secondary | ICD-10-CM | POA: Insufficient documentation

## 2020-05-27 ENCOUNTER — Other Ambulatory Visit (HOSPITAL_COMMUNITY)
Admission: RE | Admit: 2020-05-27 | Discharge: 2020-05-27 | Disposition: A | Discharge status: Home / Self Care | Payer: Medicare Other | Source: Ambulatory Visit | Attending: Adult Health | Admitting: Adult Health

## 2020-05-27 ENCOUNTER — Encounter: Payer: Self-pay | Admitting: Adult Health

## 2020-05-27 ENCOUNTER — Ambulatory Visit (INDEPENDENT_AMBULATORY_CARE_PROVIDER_SITE_OTHER): Payer: Medicare Other | Admitting: Adult Health

## 2020-05-27 VITALS — BP 144/80 | HR 69 | Ht 63.0 in | Wt 151.0 lb

## 2020-05-27 DIAGNOSIS — Z01419 Encounter for gynecological examination (general) (routine) without abnormal findings: Secondary | ICD-10-CM | POA: Diagnosis not present

## 2020-05-27 DIAGNOSIS — Z1151 Encounter for screening for human papillomavirus (HPV): Secondary | ICD-10-CM | POA: Diagnosis not present

## 2020-05-27 DIAGNOSIS — N952 Postmenopausal atrophic vaginitis: Secondary | ICD-10-CM

## 2020-05-27 DIAGNOSIS — L900 Lichen sclerosus et atrophicus: Secondary | ICD-10-CM

## 2020-05-27 DIAGNOSIS — Z124 Encounter for screening for malignant neoplasm of cervix: Secondary | ICD-10-CM | POA: Diagnosis not present

## 2020-05-27 DIAGNOSIS — Z0001 Encounter for general adult medical examination with abnormal findings: Secondary | ICD-10-CM | POA: Insufficient documentation

## 2020-05-27 HISTORY — DX: Lichen sclerosus et atrophicus: L90.0

## 2020-05-27 MED ORDER — PREMARIN 0.625 MG/GM VA CREA: TOPICAL_CREAM | VAGINAL | 12 refills | Status: DC

## 2020-05-27 MED ORDER — CLOBETASOL PROPIONATE 0.05 % EX CREA: TOPICAL_CREAM | CUTANEOUS | 2 refills | Status: DC

## 2020-05-27 NOTE — Progress Notes (Signed)
Patient ID: Valerie Parker, female   DOB: [birth year], 64 y.o.   MRN: [id number] History of Present Illness: Valerie Parker is a 64 year old white female, widowed, PM , in for pap and pelvic. She is CNA at [employer], works prn. PCP is Cherly Beach NP   Current Medications, Allergies, Past Medical History, Past Surgical History, Family History and Social History were reviewed in [employer] record.     Review of Systems: Patient denies any headaches, hearing loss(has noticed some changes), fatigue, blurred vision, shortness of breath, chest pain, abdominal pain, problems with bowel movements, urination, or intercourse(not active). No joint pain or mood swings. Has sore spot in vaginal area    Physical Exam:BP (!) 144/80 (BP Location: Left Arm, Patient Position: Sitting, Cuff Size: Normal)   Pulse 69   Ht 5\' 3"  (1.6 m)   Wt 151 lb (68.5 kg)   BMI 26.75 kg/m  General:  Well developed, well nourished, no acute distress Skin:  Warm and dry Lungs; Clear to auscultation bilaterally Cardiovascular: Regular rate and rhythm Pelvic:  External genitalia is normal in appearance, has 2 cm area left inner labia at introitus that is red and tender  The vagina is atrophic,pale and dry.Small Pederson speculum used for exam. Urethra has no lesions or masses. The cervix is smooth, pap with high risk HPV genotyping performed.  Uterus is felt to be normal size, shape, and contour.  No adnexal masses or tenderness noted.Bladder is non tender, no masses felt. Rectal:  deferrred Psych:  No mood changes, alert and cooperative,seems happy AA is 0 Fall risk is low PHQ 9 score is 0  Upstream - 05/27/20 1105      Pregnancy Intention Screening   Does the patient want to become pregnant in the next year? N/A   64 post menopausal   Does the patient's partner want to become pregnant in the next year? N/A    Would the patient like to discuss contraceptive options today? N/A      Contraception Wrap Up    Contraception Counseling Provided No   post menopausal        Examination chaperoned by Dwyane Dee LPN.  Impression and Plan: 1. Routine cervical smear Pap sent  2. Encounter for cervical Pap smear with pelvic exam Pap sent,can stop paps between 65-70  Physical with PCP Labs with PCP Colonoscopy per GI Mammogram 05/12/20,negative for malignancy She had DEXA 05/11/20,showed osteopenia   3. Lichen sclerosus et atrophicus Showed pt pictures in [facility], with explanation, will treat with temovate and recheck in 6 weeks If persists may need biopsy  4. Vaginal atrophy Will try PVC,samples given  Meds ordered this encounter  Medications  . clobetasol cream (TEMOVATE) 0.05 %    Sig: Use bid for 2 weeks then 2-3 x weekly    Dispense:  45 g    Refill:  2    Order Specific Question:   Supervising Provider    Answer:   Elonda Husky, LUTHER H [[id number]]  . conjugated estrogens (PREMARIN) vaginal cream    Sig: Use daily for 2 weeks then 2-3 x weekly    Dispense:  24 g    Refill:  12    Order Specific Question:   Supervising Provider    Answer:   Tania Ade H [[id number]]

## 2020-05-29 LAB — CYTOLOGY - PAP
Comment: NEGATIVE
Diagnosis: NEGATIVE
High risk HPV: NEGATIVE

## 2020-06-18 ENCOUNTER — Other Ambulatory Visit: Payer: Self-pay | Admitting: Family Medicine

## 2020-06-19 ENCOUNTER — Ambulatory Visit: Payer: Medicare Other | Admitting: Family Medicine

## 2020-06-29 ENCOUNTER — Other Ambulatory Visit: Payer: Self-pay

## 2020-06-29 MED ORDER — AMLODIPINE BESYLATE 5 MG PO TABS
5.0000 mg | ORAL_TABLET | Freq: Every day | ORAL | 0 refills | Status: DC
Start: 2020-06-29 — End: 2020-10-22

## 2020-07-08 ENCOUNTER — Ambulatory Visit: Payer: Medicare Other | Admitting: Adult Health

## 2020-07-21 ENCOUNTER — Ambulatory Visit: Payer: Medicare Other | Admitting: Adult Health

## 2020-08-05 ENCOUNTER — Encounter: Payer: Self-pay | Admitting: Adult Health

## 2020-08-05 ENCOUNTER — Ambulatory Visit (INDEPENDENT_AMBULATORY_CARE_PROVIDER_SITE_OTHER): Payer: Medicare Other | Admitting: Adult Health

## 2020-08-05 ENCOUNTER — Other Ambulatory Visit: Payer: Self-pay

## 2020-08-05 VITALS — BP 130/83 | HR 68 | Ht 63.0 in | Wt 150.0 lb

## 2020-08-05 DIAGNOSIS — N952 Postmenopausal atrophic vaginitis: Secondary | ICD-10-CM | POA: Diagnosis not present

## 2020-08-05 DIAGNOSIS — L900 Lichen sclerosus et atrophicus: Secondary | ICD-10-CM

## 2020-08-05 NOTE — Progress Notes (Signed)
°  Subjective:     Patient ID: Valerie Parker, female   DOB: Mar 14, 1956, 64 y.o.   MRN: [id number]  HPI [name] is a 64 year old white female,widowed, PM in for recheck of LSA after using temovate and PVC, bu she says she misses it some. PCP is Cherly Beach NP  Review of Systems Feels better, in vaginal area  Reviewed past medical,surgical, social and family history. Reviewed medications and allergies.     Objective:   Physical Exam BP 130/83 (BP Location: Left Arm, Patient Position: Sitting, Cuff Size: Normal)    Pulse 68    Ht 5\' 3"  (1.6 m)    Wt 150 lb (68 kg)    BMI 26.57 kg/m    Skin warm and dry.Pelvic: external genitalia is normal in appearance, the area at left inner labia, is pink now, not red and less tender and smallervagina:atrophic but less dry. Examination chaperoned by Diona Fanti CMA.  Assessment:     1. Lichen sclerosus et atrophicus  Try to use temovate 2-3 x weekly,has refills   2. Vaginal atrophy Use PVC 2-3 x weekly    Plan:     Follow up in 4 months or sooner if needed

## 2020-08-27 ENCOUNTER — Ambulatory Visit: Payer: Medicare Other | Admitting: Physician Assistant

## 2020-09-17 ENCOUNTER — Other Ambulatory Visit: Payer: Self-pay | Admitting: Family Medicine

## 2020-09-17 DIAGNOSIS — I10 Essential (primary) hypertension: Secondary | ICD-10-CM

## 2020-10-20 ENCOUNTER — Ambulatory Visit: Payer: Medicare Other | Admitting: Family Medicine

## 2020-10-20 ENCOUNTER — Ambulatory Visit (INDEPENDENT_AMBULATORY_CARE_PROVIDER_SITE_OTHER): Payer: Medicare HMO | Admitting: Nurse Practitioner

## 2020-10-20 ENCOUNTER — Other Ambulatory Visit: Payer: Self-pay

## 2020-10-20 ENCOUNTER — Encounter: Payer: Self-pay | Admitting: Nurse Practitioner

## 2020-10-20 VITALS — BP 150/84 | HR 64 | Temp 98.0°F | Resp 20 | Ht 63.0 in | Wt 148.0 lb

## 2020-10-20 DIAGNOSIS — G8929 Other chronic pain: Secondary | ICD-10-CM

## 2020-10-20 DIAGNOSIS — E7800 Pure hypercholesterolemia, unspecified: Secondary | ICD-10-CM | POA: Diagnosis not present

## 2020-10-20 DIAGNOSIS — I10 Essential (primary) hypertension: Secondary | ICD-10-CM

## 2020-10-20 DIAGNOSIS — E559 Vitamin D deficiency, unspecified: Secondary | ICD-10-CM | POA: Diagnosis not present

## 2020-10-20 DIAGNOSIS — M25531 Pain in right wrist: Secondary | ICD-10-CM

## 2020-10-20 DIAGNOSIS — M25562 Pain in left knee: Secondary | ICD-10-CM

## 2020-10-20 DIAGNOSIS — M2041 Other hammer toe(s) (acquired), right foot: Secondary | ICD-10-CM

## 2020-10-20 DIAGNOSIS — M2042 Other hammer toe(s) (acquired), left foot: Secondary | ICD-10-CM | POA: Diagnosis not present

## 2020-10-20 NOTE — Assessment & Plan Note (Signed)
-  will check vit D level today -takes otc vit d

## 2020-10-20 NOTE — Assessment & Plan Note (Signed)
-  she states she just got new insurance and would like to see ortho -last x-rays were from 2017 -referral to ortho for chronic knee pain

## 2020-10-20 NOTE — Assessment & Plan Note (Signed)
-  worse with writing and driving -mild tingling with Phalen test today -referral to ortho

## 2020-10-20 NOTE — Assessment & Plan Note (Signed)
-  BP slightly elevated today, but she hasn't taken her BP meds today -no changes to meds

## 2020-10-20 NOTE — Progress Notes (Signed)
Acute Office Visit  Subjective:    Patient ID: Valerie Parker, female    DOB: May 25, 1956, 65 y.o.   MRN: [id number]  Chief Complaint  Patient presents with  . Hypertension    Follow up; medication refills.     HPI Patient is in today for BP check. She states she hasn't taken her BP medication today.  She would like to see podiatry for hammer toe on both feet. She states her feet are painful, and she can't walk barefooted any more.  Her left knee is sore after having an injury several years ago. She saw ortho in the past, for left knee pain. She states that when she bumps into things, she has excruciating pain.  Past Medical History:  Diagnosis Date  . ABDOMINAL BLOATING 11/18/2008   Qualifier: Diagnosis of  By: [name] MD, [name]    . Basal cell carcinoma    on face  . Hyperlipidemia   . Hypertension   . Low back pain     Past Surgical History:  Procedure Laterality Date  . CESAREAN SECTION     3 times  . COLONOSCOPY  2010   repeat 3 years - polyps  . TONSILLECTOMY      Family History  Problem Relation Age of Onset  . Cancer Mother 73       alcoholism; throat cancer  . Diabetes Mother   . Alcohol abuse Mother   . COPD Father   . Alcohol abuse Father   . Heart disease Father   . Hypertension Father   . Diabetes Sister   . Hepatitis C Brother   . Hepatitis C Sister   . Hepatitis C Sister   . Mental illness Daughter   . Colon cancer Neg Hx     Social History   Socioeconomic History  . Marital status: Widowed    Spouse name: Not on file  . Number of children: 3  . Years of education: Not on file  . Highest education level: Some college, no degree  Occupational History  . Occupation: CNA  Tobacco Use  . Smoking status: Never Smoker  . Smokeless tobacco: Never Used  Vaping Use  . Vaping Use: Never used  Substance and Sexual Activity  . Alcohol use: No    Alcohol/week: 0.0 standard drinks  . Drug use: No  . Sexual activity: Not Currently    Birth  control/protection: Post-menopausal  Other Topics Concern  . Not on file  Social History Narrative   Lives alone now-husband passed in 2015- from heart condition    3 children-live close by; 4 granchildren   4 cats and 1 dog   Enjoys: out and about, grandchildren      Diet: eats all food groups   Caffeine: 1 pot of coffee daily   Water: 1-2 cups daily       Wears seat belt   Does not use phone while driving    Smoke detectors at home.   Social Determinants of Health   Financial Resource Strain: Low Risk   . Difficulty of Paying Living Expenses: Not hard at all  Food Insecurity: No Food Insecurity  . Worried About Charity fundraiser in the Last Year: Never true  . Ran Out of Food in the Last Year: Never true  Transportation Needs: No Transportation Needs  . Lack of Transportation (Medical): No  . Lack of Transportation (Non-Medical): No  Physical Activity: Insufficiently Active  . Days of Exercise per Week: 3  days  . Minutes of Exercise per Session: 10 min  Stress: No Stress Concern Present  . Feeling of Stress : Not at all  Social Connections: Socially Isolated  . Frequency of Communication with Friends and Family: More than three times a week  . Frequency of Social Gatherings with Friends and Family: More than three times a week  . Attends Religious Services: Never  . Active Member of Clubs or Organizations: No  . Attends Archivist Meetings: Never  . Marital Status: Widowed  Intimate Partner Violence: Not At Risk  . Fear of Current or Ex-Partner: No  . Emotionally Abused: No  . Physically Abused: No  . Sexually Abused: No    Outpatient Medications Prior to Visit  Medication Sig Dispense Refill  . amLODipine (NORVASC) 5 MG tablet Take 1 tablet (5 mg total) by mouth daily. 90 tablet 0  . clobetasol cream (TEMOVATE) 0.05 % Use bid for 2 weeks then 2-3 x weekly 45 g 2  . conjugated estrogens (PREMARIN) vaginal cream Use daily for 2 weeks then 2-3 x weekly 24  g 12  . lisinopril (ZESTRIL) 40 MG tablet Take 1 tablet by mouth once daily 90 tablet 0  . VITAMIN D PO Take by mouth.     No facility-administered medications prior to visit.    No Known Allergies  Review of Systems  Constitutional: Negative.   Respiratory: Negative.   Cardiovascular: Negative.   Musculoskeletal: Positive for arthralgias.  Neurological: Negative.   Psychiatric/Behavioral: Negative.        Objective:    Physical Exam Constitutional:      Appearance: Normal appearance.  Cardiovascular:     Rate and Rhythm: Normal rate and regular rhythm.     Pulses: Normal pulses.     Heart sounds: Normal heart sounds.  Pulmonary:     Effort: Pulmonary effort is normal.     Breath sounds: Normal breath sounds.  Musculoskeletal:        General: No swelling or tenderness.     Comments: Hammer toe to both feet  Neurological:     Mental Status: She is alert.     BP (!) 150/84   Pulse 64   Temp 98 F (36.7 C)   Resp 20   Ht 5' 3"  (1.6 m)   Wt 148 lb (67.1 kg)   SpO2 95%   BMI 26.22 kg/m  Wt Readings from Last 3 Encounters:  10/20/20 148 lb (67.1 kg)  08/05/20 150 lb (68 kg)  05/27/20 151 lb (68.5 kg)    Health Maintenance Due  Topic Date Due  . COVID-19 Vaccine (1) Never done  . TETANUS/TDAP  08/22/2017  . PNA vac Low Risk Adult (1 of 2 - PCV13) Never done  . INFLUENZA VACCINE  03/22/2020    There are no preventive care reminders to display for this patient.   Lab Results  Component Value Date   TSH 1.64 04/08/2009   Lab Results  Component Value Date   WBC 4.2 11/28/2018   HGB 13.3 11/28/2018   HCT 41.1 11/28/2018   MCV 91.7 11/28/2018   PLT 207 11/28/2018   Lab Results  Component Value Date   NA 137 11/28/2018   K 3.8 11/28/2018   CO2 25 11/28/2018   GLUCOSE 101 (H) 11/28/2018   BUN 20 11/28/2018   CREATININE 0.68 11/28/2018   BILITOT 0.4 11/28/2018   ALKPHOS 68 11/28/2018   AST 22 11/28/2018   ALT 30 11/28/2018   PROT  7.2  11/28/2018   ALBUMIN 3.9 11/28/2018   CALCIUM 8.7 (L) 11/28/2018   ANIONGAP 5 11/28/2018   Lab Results  Component Value Date   CHOL 215 (H) 10/17/2017   Lab Results  Component Value Date   HDL 59 10/17/2017   Lab Results  Component Value Date   LDLCALC 146 (H) 10/17/2017   Lab Results  Component Value Date   TRIG 49 10/17/2017   Lab Results  Component Value Date   CHOLHDL 3.6 10/17/2017   No results found for: HGBA1C     Assessment & Plan:   Problem List Items Addressed This Visit      Cardiovascular and Mediastinum   Essential hypertension    -BP slightly elevated today, but she hasn't taken her BP meds today -no changes to meds      Relevant Orders   CBC with Differential/Platelet   CMP14+EGFR     Musculoskeletal and Integument   Hammer toes, bilateral    -causing pain -referral to podiatry      Relevant Orders   Ambulatory referral to Podiatry     Other   HYPERCHOLESTEROLEMIA - Primary   Relevant Orders   Lipid Panel With LDL/HDL Ratio   Chronic pain of left knee    -she states she just got new insurance and would like to see ortho -last x-rays were from 2017 -referral to ortho for chronic knee pain      Right wrist pain    -worse with writing and driving -mild tingling with Phalen test today -referral to ortho      Vitamin D deficiency    -will check vit D level today -takes otc vit d      Relevant Orders   Vitamin D (25 hydroxy)       No orders of the defined types were placed in this encounter.    Noreene Larsson, NP

## 2020-10-20 NOTE — Patient Instructions (Signed)
Fasting labs to be drawn today. We will call with results.

## 2020-10-20 NOTE — Assessment & Plan Note (Signed)
-  causing pain -referral to podiatry

## 2020-10-21 ENCOUNTER — Ambulatory Visit (INDEPENDENT_AMBULATORY_CARE_PROVIDER_SITE_OTHER): Payer: Medicare HMO

## 2020-10-21 ENCOUNTER — Ambulatory Visit: Payer: Medicare HMO | Admitting: Podiatry

## 2020-10-21 ENCOUNTER — Other Ambulatory Visit: Payer: Self-pay | Admitting: Nurse Practitioner

## 2020-10-21 ENCOUNTER — Encounter: Payer: Self-pay | Admitting: Podiatry

## 2020-10-21 DIAGNOSIS — M21612 Bunion of left foot: Secondary | ICD-10-CM | POA: Diagnosis not present

## 2020-10-21 DIAGNOSIS — M2042 Other hammer toe(s) (acquired), left foot: Secondary | ICD-10-CM

## 2020-10-21 DIAGNOSIS — M2041 Other hammer toe(s) (acquired), right foot: Secondary | ICD-10-CM

## 2020-10-21 DIAGNOSIS — M21619 Bunion of unspecified foot: Secondary | ICD-10-CM

## 2020-10-21 DIAGNOSIS — M21611 Bunion of right foot: Secondary | ICD-10-CM

## 2020-10-21 DIAGNOSIS — M779 Enthesopathy, unspecified: Secondary | ICD-10-CM

## 2020-10-21 LAB — CMP14+EGFR
ALT: 19 IU/L (ref 0–32)
AST: 18 IU/L (ref 0–40)
Albumin/Globulin Ratio: 1.8 (ref 1.2–2.2)
Albumin: 4.4 g/dL (ref 3.8–4.8)
Alkaline Phosphatase: 76 IU/L (ref 44–121)
BUN/Creatinine Ratio: 18 (ref 12–28)
BUN: 14 mg/dL (ref 8–27)
Bilirubin Total: 0.6 mg/dL (ref 0.0–1.2)
CO2: 24 mmol/L (ref 20–29)
Calcium: 9.7 mg/dL (ref 8.7–10.3)
Chloride: 105 mmol/L (ref 96–106)
Creatinine, Ser: 0.77 mg/dL (ref 0.57–1.00)
Globulin, Total: 2.5 g/dL (ref 1.5–4.5)
Glucose: 95 mg/dL (ref 65–99)
Potassium: 5 mmol/L (ref 3.5–5.2)
Sodium: 142 mmol/L (ref 134–144)
Total Protein: 6.9 g/dL (ref 6.0–8.5)
eGFR: 86 mL/min/{1.73_m2} (ref >59)

## 2020-10-21 LAB — LIPID PANEL WITH LDL/HDL RATIO
Cholesterol, Total: 199 mg/dL (ref 100–199)
HDL: 53 mg/dL (ref >39)
LDL Chol Calc (NIH): 138 mg/dL — ABNORMAL HIGH (ref 0–99)
LDL/HDL Ratio: 2.6 ratio (ref 0.0–3.2)
Triglycerides: 41 mg/dL (ref 0–149)
VLDL Cholesterol Cal: 8 mg/dL (ref 5–40)

## 2020-10-21 LAB — CBC WITH DIFFERENTIAL/PLATELET
Basophils Absolute: 0 10*3/uL (ref 0.0–0.2)
Basos: 0 %
EOS (ABSOLUTE): 0.1 10*3/uL (ref 0.0–0.4)
Eos: 2 %
Hematocrit: 40.7 % (ref 34.0–46.6)
Hemoglobin: 13.8 g/dL (ref 11.1–15.9)
Immature Grans (Abs): 0 10*3/uL (ref 0.0–0.1)
Immature Granulocytes: 0 %
Lymphocytes Absolute: 1.6 10*3/uL (ref 0.7–3.1)
Lymphs: 31 %
MCH: 30.1 pg (ref 26.6–33.0)
MCHC: 33.9 g/dL (ref 31.5–35.7)
MCV: 89 fL (ref 79–97)
Monocytes Absolute: 0.5 10*3/uL (ref 0.1–0.9)
Monocytes: 10 %
Neutrophils Absolute: 2.9 10*3/uL (ref 1.4–7.0)
Neutrophils: 57 %
Platelets: 232 10*3/uL (ref 150–450)
RBC: 4.58 x10E6/uL (ref 3.77–5.28)
RDW: 12 % (ref 11.7–15.4)
WBC: 5 10*3/uL (ref 3.4–10.8)

## 2020-10-21 LAB — VITAMIN D 25 HYDROXY (VIT D DEFICIENCY, FRACTURES): Vit D, 25-Hydroxy: 36.3 ng/mL (ref 30.0–100.0)

## 2020-10-21 MED ORDER — ATORVASTATIN CALCIUM 20 MG PO TABS: 20.0000 mg | ORAL_TABLET | Freq: Every day | ORAL | 3 refills | Status: DC

## 2020-10-21 MED ORDER — TRIAMCINOLONE ACETONIDE 10 MG/ML IJ SUSP
10.0000 mg | Freq: Once | INTRAMUSCULAR | Status: AC
  Administered 2020-10-21: 10 mg

## 2020-10-21 NOTE — Progress Notes (Signed)
Subjective:   Patient ID: Valerie Parker, female   DOB: 65 y.o.   MRN: [id number]   HPI Patient presents stating the second digits have really gone out of position and not getting pain right over left in the joint.  States that it feels like she is walking on an abnormal bone structure and it is moderately tender and has been this way for a few months.  Patient does have bunions also she is aware of   Review of Systems  All other systems reviewed and are negative.       Objective:  Physical Exam Vitals and nursing note reviewed.  Constitutional:      Appearance: She is well-developed and well-nourished.  Cardiovascular:     Pulses: Intact distal pulses.  Pulmonary:     Effort: Pulmonary effort is normal.  Musculoskeletal:        General: Normal range of motion.  Skin:    General: Skin is warm.  Neurological:     Mental Status: She is alert.   Neurovascular status found to be intact with patient found to have deviation second digit bilateral dorsal medially dislocated with inflammation around the joint surface that is painful when pressed.  Patient is found to have good digital perfusion well oriented x3 with moderate structural bunion deformity bilateral      Assessment:  Possibility that this is a flexor plate dislocation or tear creating increased stress on the second MPJ right over left with structural bunion deformity noted bilateral     Plan:  H&P all conditions reviewed sterile prep done aspirated the second MPJ getting out a small amount of clear fluid injected quarter cc dexamethasone Kenalog applied thick padding right and I advised this patient on rigid bottom shoes.  Reappoint 2 weeks may ultimately require surgical intervention  X-rays indicate that the patient does have dorsal medial dislocation digit to right over left with pathology bilateral

## 2020-10-21 NOTE — Progress Notes (Signed)
Her LDL was elevated, so I sent in a low dose of atorvastatin. She should take this in the evening to decrease the risk of side effects (most common is body aches).

## 2020-10-22 ENCOUNTER — Other Ambulatory Visit: Payer: Self-pay | Admitting: Family Medicine

## 2020-10-24 DIAGNOSIS — Z0100 Encounter for examination of eyes and vision without abnormal findings: Secondary | ICD-10-CM | POA: Diagnosis not present

## 2020-10-24 DIAGNOSIS — H524 Presbyopia: Secondary | ICD-10-CM | POA: Diagnosis not present

## 2020-11-12 ENCOUNTER — Encounter: Payer: Self-pay | Admitting: Podiatry

## 2020-11-12 ENCOUNTER — Ambulatory Visit: Payer: Medicare HMO | Admitting: Podiatry

## 2020-11-12 ENCOUNTER — Other Ambulatory Visit: Payer: Self-pay

## 2020-11-12 DIAGNOSIS — M2041 Other hammer toe(s) (acquired), right foot: Secondary | ICD-10-CM

## 2020-11-12 DIAGNOSIS — M779 Enthesopathy, unspecified: Secondary | ICD-10-CM

## 2020-11-12 DIAGNOSIS — M21619 Bunion of unspecified foot: Secondary | ICD-10-CM | POA: Diagnosis not present

## 2020-11-12 DIAGNOSIS — M2042 Other hammer toe(s) (acquired), left foot: Secondary | ICD-10-CM

## 2020-11-15 NOTE — Progress Notes (Signed)
Subjective:   Patient ID: Valerie Parker, female   DOB: 65 y.o.   MRN: [id number]   HPI Patient states the joint feels some better but the position of the toe is very bothersome and I would prefer to have it corrected.  States that it gets sore and makes it hard for her to walk comfortably and that the left is also starting to bother her not to the same degree   ROS      Objective:  Physical Exam  Neurovascular status intact with second digit right which is relatively out of position with elevation medial dislocation with inflammation of the joint surface and moderate discomfort of the second MPJ of the left foot     Assessment:  Digital deformity digit to right with elevation and medial dislocation of the toe along with capsulitis of the left second MPJ with mild dislocation noted     Plan:  H&P reviewed conditions.  She would like to have the toe relocated I did discuss digital stabilization along with shortening osteotomy.  I did explain the surgery and the fact that the toe will still be slightly out of position but I do think long-term it would be in her best interest and I reviewed what would be required and she wants surgery.  I did explain that the toe will still not be in perfect position but it should make a big difference and she is okay with this and read consent form going over alternative treatments complications.  Patient scheduled for outpatient surgery with boot dispensed today with all instructions on using it and I want her to get used to it prior to surgery.  For the left I did recommend injection treatment to be done at the time that we do the surgery on the right foot and she does understand that recovery is approximately 6 months with this procedure

## 2020-11-23 ENCOUNTER — Telehealth: Payer: Self-pay | Admitting: Urology

## 2020-11-23 NOTE — Telephone Encounter (Signed)
DOS -  12/08/20  METATARSAL OSTEOTOMY 2ND RIGHT --- 64680 HAMMERTOE REPAIR 2ND RIGHT --- 32122 INJECTION 2ND LEFT MPJ --- 20550  [facility] EFFECTIVE DATE - 06/20/20   PER [facility] ([place] CPT CODES 48250, [phone] AND 88891 HAS BEEN APPROVED, AUTH #[id number]

## 2020-12-03 ENCOUNTER — Ambulatory Visit: Payer: Medicare HMO | Admitting: Adult Health

## 2020-12-04 ENCOUNTER — Ambulatory Visit: Payer: Medicare Other | Admitting: Adult Health

## 2020-12-14 ENCOUNTER — Encounter: Payer: Medicare HMO | Admitting: Podiatry

## 2020-12-16 ENCOUNTER — Encounter: Payer: Medicare HMO | Admitting: Podiatry

## 2020-12-25 ENCOUNTER — Encounter: Payer: Self-pay | Admitting: Nurse Practitioner

## 2020-12-28 ENCOUNTER — Other Ambulatory Visit: Payer: Self-pay

## 2020-12-28 ENCOUNTER — Other Ambulatory Visit: Payer: Self-pay | Admitting: Family Medicine

## 2020-12-28 ENCOUNTER — Encounter: Payer: Self-pay | Admitting: Internal Medicine

## 2020-12-28 ENCOUNTER — Ambulatory Visit (INDEPENDENT_AMBULATORY_CARE_PROVIDER_SITE_OTHER): Payer: Medicare HMO | Admitting: Internal Medicine

## 2020-12-28 VITALS — BP 133/79 | HR 70 | Temp 97.9°F | Resp 18 | Ht 63.0 in | Wt 149.0 lb

## 2020-12-28 DIAGNOSIS — I10 Essential (primary) hypertension: Secondary | ICD-10-CM

## 2020-12-28 DIAGNOSIS — N3000 Acute cystitis without hematuria: Secondary | ICD-10-CM | POA: Diagnosis not present

## 2020-12-28 DIAGNOSIS — R300 Dysuria: Secondary | ICD-10-CM | POA: Diagnosis not present

## 2020-12-28 LAB — POCT URINALYSIS DIP (CLINITEK)
Blood, UA: NEGATIVE
Glucose, UA: NEGATIVE mg/dL
Nitrite, UA: NEGATIVE
Spec Grav, UA: 1.025 (ref 1.010–1.025)
Urobilinogen, UA: 0.2 E.U./dL
pH, UA: 5.5 (ref 5.0–8.0)

## 2020-12-28 MED ORDER — NITROFURANTOIN MONOHYD MACRO 100 MG PO CAPS: 100.0000 mg | ORAL_CAPSULE | Freq: Two times a day (BID) | ORAL | 0 refills | Status: DC

## 2020-12-28 NOTE — Telephone Encounter (Signed)
Pt added to dr patel 12-28-20

## 2020-12-28 NOTE — Progress Notes (Signed)
Acute Office Visit  Subjective:    Patient ID: Valerie Parker, female    DOB: [birth year], 65 y.o.   MRN: [id number]  Chief Complaint  Patient presents with  . Dysuria    Pt thinks she has uti she has frequency burning sensation and pressure since Thursday     HPI Patient is in today for evaluation of dysuria and lower abdominal discomfort for last 5 days. She also reports urinary frequency. She denies any hematuria or vaginal discharge. Denies any fever, chills, nausea or vomiting.  Past Medical History:  Diagnosis Date  . ABDOMINAL BLOATING 11/18/2008   Qualifier: Diagnosis of  By: [name] MD, [name]    . Basal cell carcinoma    on face  . Hyperlipidemia   . Hypertension   . Low back pain     Past Surgical History:  Procedure Laterality Date  . CESAREAN SECTION     3 times  . COLONOSCOPY  2010   repeat 3 years - polyps  . TONSILLECTOMY      Family History  Problem Relation Age of Onset  . Cancer Mother 10       alcoholism; throat cancer  . Diabetes Mother   . Alcohol abuse Mother   . COPD Father   . Alcohol abuse Father   . Heart disease Father   . Hypertension Father   . Diabetes Sister   . Hepatitis C Brother   . Hepatitis C Sister   . Hepatitis C Sister   . Mental illness Daughter   . Colon cancer Neg Hx     Social History   Socioeconomic History  . Marital status: Widowed    Spouse name: Not on file  . Number of children: 3  . Years of education: Not on file  . Highest education level: Some college, no degree  Occupational History  . Occupation: CNA  Tobacco Use  . Smoking status: Never Smoker  . Smokeless tobacco: Never Used  Vaping Use  . Vaping Use: Never used  Substance and Sexual Activity  . Alcohol use: No    Alcohol/week: 0.0 standard drinks  . Drug use: No  . Sexual activity: Not Currently    Birth control/protection: Post-menopausal  Other Topics Concern  . Not on file  Social History Narrative   Lives alone now-husband passed in  2015- from heart condition    3 children-live close by; 4 granchildren   4 cats and 1 dog   Enjoys: out and about, grandchildren      Diet: eats all food groups   Caffeine: 1 pot of coffee daily   Water: 1-2 cups daily       Wears seat belt   Does not use phone while driving    Smoke detectors at home.   Social Determinants of Health   Financial Resource Strain: Low Risk   . Difficulty of Paying Living Expenses: Not hard at all  Food Insecurity: No Food Insecurity  . Worried About Charity fundraiser in the Last Year: Never true  . Ran Out of Food in the Last Year: Never true  Transportation Needs: No Transportation Needs  . Lack of Transportation (Medical): No  . Lack of Transportation (Non-Medical): No  Physical Activity: Insufficiently Active  . Days of Exercise per Week: 3 days  . Minutes of Exercise per Session: 10 min  Stress: No Stress Concern Present  . Feeling of Stress : Not at all  Social Connections: Socially Isolated  .  Frequency of Communication with Friends and Family: More than three times a week  . Frequency of Social Gatherings with Friends and Family: More than three times a week  . Attends Religious Services: Never  . Active Member of Clubs or Organizations: No  . Attends Archivist Meetings: Never  . Marital Status: Widowed  Intimate Partner Violence: Not At Risk  . Fear of Current or Ex-Partner: No  . Emotionally Abused: No  . Physically Abused: No  . Sexually Abused: No    Outpatient Medications Prior to Visit  Medication Sig Dispense Refill  . amLODipine (NORVASC) 5 MG tablet Take 1 tablet by mouth once daily 90 tablet 0  . atorvastatin (LIPITOR) 20 MG tablet Take 1 tablet (20 mg total) by mouth daily. 90 tablet 3  . lisinopril (ZESTRIL) 40 MG tablet Take 1 tablet by mouth once daily 90 tablet 0  . VITAMIN D PO Take by mouth.    . clobetasol cream (TEMOVATE) 0.05 % Use bid for 2 weeks then 2-3 x weekly (Patient not taking: Reported on  12/28/2020) 45 g 2  . conjugated estrogens (PREMARIN) vaginal cream Use daily for 2 weeks then 2-3 x weekly (Patient not taking: Reported on 12/28/2020) 24 g 12   No facility-administered medications prior to visit.    No Known Allergies  Review of Systems  Constitutional: Negative for chills and fever.  Respiratory: Negative for cough and shortness of breath.   Cardiovascular: Negative for chest pain and palpitations.  Gastrointestinal: Negative for diarrhea, nausea and vomiting.  Genitourinary: Positive for dysuria and frequency. Negative for flank pain, pelvic pain and vaginal discharge.  Skin: Negative for rash.       Objective:    Physical Exam Vitals reviewed.  Constitutional:      General: She is not in acute distress.    Appearance: She is not diaphoretic.  HENT:     Head: Normocephalic and atraumatic.     Nose: Nose normal.     Mouth/Throat:     Mouth: Mucous membranes are moist.  Eyes:     General: No scleral icterus.    Extraocular Movements: Extraocular movements intact.  Cardiovascular:     Rate and Rhythm: Normal rate and regular rhythm.     Pulses: Normal pulses.     Heart sounds: No murmur heard.   Pulmonary:     Breath sounds: Normal breath sounds. No wheezing or rales.  Abdominal:     Palpations: Abdomen is soft.     Tenderness: There is abdominal tenderness (Mild, suprapubic). There is no right CVA tenderness, left CVA tenderness, guarding or rebound.  Musculoskeletal:     Cervical back: Neck supple. No tenderness.     Right lower leg: No edema.     Left lower leg: No edema.  Skin:    General: Skin is warm.     Findings: No rash.  Neurological:     General: No focal deficit present.     Mental Status: She is alert and oriented to person, place, and time.  Psychiatric:        Mood and Affect: Mood normal.        Behavior: Behavior normal.     BP 133/79 (BP Location: Right Arm, Patient Position: Sitting, Cuff Size: Normal)   Pulse 70   Temp  97.9 F (36.6 C) (Oral)   Resp 18   Ht _0  (1.6 m)   Wt 149 lb (67.6 kg)   SpO2 97%  BMI 26.39 kg/m  Wt Readings from Last 3 Encounters:  12/28/20 149 lb (67.6 kg)  10/20/20 148 lb (67.1 kg)  08/05/20 150 lb (68 kg)    Health Maintenance Due  Topic Date Due  . COVID-19 Vaccine (1) Never done  . TETANUS/TDAP  08/22/2017  . PNA vac Low Risk Adult (1 of 2 - PCV13) Never done    There are no preventive care reminders to display for this patient.   Lab Results  Component Value Date   TSH 1.64 04/08/2009   Lab Results  Component Value Date   WBC 5.0 10/20/2020   HGB 13.8 10/20/2020   HCT 40.7 10/20/2020   MCV 89 10/20/2020   PLT 232 10/20/2020   Lab Results  Component Value Date   NA 142 10/20/2020   K 5.0 10/20/2020   CO2 24 10/20/2020   GLUCOSE 95 10/20/2020   BUN 14 10/20/2020   CREATININE 0.77 10/20/2020   BILITOT 0.6 10/20/2020   ALKPHOS 76 10/20/2020   AST 18 10/20/2020   ALT 19 10/20/2020   PROT 6.9 10/20/2020   ALBUMIN 4.4 10/20/2020   CALCIUM 9.7 10/20/2020   ANIONGAP 5 11/28/2018   EGFR 86 10/20/2020   Lab Results  Component Value Date   CHOL 199 10/20/2020   Lab Results  Component Value Date   HDL 53 10/20/2020   Lab Results  Component Value Date   LDLCALC 138 (H) 10/20/2020   Lab Results  Component Value Date   TRIG 41 10/20/2020   Lab Results  Component Value Date   CHOLHDL 3.6 10/17/2017   No results found for: HGBA1C     Assessment & Plan:   Problem List Items Addressed This Visit   None   Visit Diagnoses    Acute cystitis without hematuria    -  Primary UA reviewed Check urine culture Started Macrobid Will adjust if needed depending on culture Advised to maintain at least 64 ounces of fluid intake   Relevant Medications   nitrofurantoin, macrocrystal-monohydrate, (MACROBID) 100 MG capsule   Other Relevant Orders   POCT URINALYSIS DIP (CLINITEK) (Completed)   Urine Culture       Meds ordered this encounter   Medications  . nitrofurantoin, macrocrystal-monohydrate, (MACROBID) 100 MG capsule    Sig: Take 1 capsule (100 mg total) by mouth 2 (two) times daily.    Dispense:  10 capsule    Refill:  0     Kymoni Lesperance Keith Rake, MD

## 2020-12-28 NOTE — Patient Instructions (Signed)
Urinary Tract Infection, Adult A urinary tract infection (UTI) is an infection of any part of the urinary tract. The urinary tract includes:  The kidneys.  The ureters.  The bladder.  The urethra. These organs make, store, and get rid of pee (urine) in the body. What are the causes? This infection is caused by germs (bacteria) in your genital area. These germs grow and cause swelling (inflammation) of your urinary tract. What increases the risk? The following factors may make you more likely to develop this condition:  Using a small, thin tube (catheter) to drain pee.  Not being able to control when you pee or poop (incontinence).  Being female. If you are female, these things can increase the risk: ? Using these methods to prevent pregnancy:  A medicine that kills sperm (spermicide).  A device that blocks sperm (diaphragm). ? Having low levels of a female hormone (estrogen). ? Being pregnant. You are more likely to develop this condition if:  You have genes that add to your risk.  You are sexually active.  You take antibiotic medicines.  You have trouble peeing because of: ? A prostate that is bigger than normal, if you are female. ? A blockage in the part of your body that drains pee from the bladder. ? A kidney stone. ? A nerve condition that affects your bladder. ? Not getting enough to drink. ? Not peeing often enough.  You have other conditions, such as: ? Diabetes. ? A weak disease-fighting system (immune system). ? Sickle cell disease. ? Gout. ? Injury of the spine. What are the signs or symptoms? Symptoms of this condition include:  Needing to pee right away.  Peeing small amounts often.  Pain or burning when peeing.  Blood in the pee.  Pee that smells bad or not like normal.  Trouble peeing.  Pee that is cloudy.  Fluid coming from the vagina, if you are female.  Pain in the belly or lower back. Other symptoms include:  Vomiting.  Not  feeling hungry.  Feeling mixed up (confused). This may be the first symptom in older adults.  Being tired and grouchy (irritable).  A fever.  Watery poop (diarrhea). How is this treated?  Taking antibiotic medicine.  Taking other medicines.  Drinking enough water. In some cases, you may need to see a specialist. Follow these instructions at home: Medicines  Take over-the-counter and prescription medicines only as told by your doctor.  If you were prescribed an antibiotic medicine, take it as told by your doctor. Do not stop taking it even if you start to feel better. General instructions  Make sure you: ? Pee until your bladder is empty. ? Do not hold pee for a long time. ? Empty your bladder after sex. ? Wipe from front to back after peeing or pooping if you are a female. Use each tissue one time when you wipe.  Drink enough fluid to keep your pee pale yellow.  Keep all follow-up visits.   Contact a doctor if:  You do not get better after 1-2 days.  Your symptoms go away and then come back. Get help right away if:  You have very bad back pain.  You have very bad pain in your lower belly.  You have a fever.  You have chills.  You feeling like you will vomit or you vomit. Summary  A urinary tract infection (UTI) is an infection of any part of the urinary tract.  This condition is caused by   germs in your genital area.  There are many risk factors for a UTI.  Treatment includes antibiotic medicines.  Drink enough fluid to keep your pee pale yellow. This information is not intended to replace advice given to you by your health care provider. Make sure you discuss any questions you have with your health care provider. Document Revised: 03/20/2020 Document Reviewed: 03/20/2020 Elsevier Patient Education  2021 Elsevier Inc.  

## 2021-01-02 LAB — URINE CULTURE

## 2021-01-13 ENCOUNTER — Encounter: Payer: Self-pay | Admitting: Podiatry

## 2021-01-13 ENCOUNTER — Other Ambulatory Visit: Payer: Self-pay

## 2021-01-13 ENCOUNTER — Ambulatory Visit: Payer: Medicare HMO | Admitting: Podiatry

## 2021-01-13 DIAGNOSIS — M2042 Other hammer toe(s) (acquired), left foot: Secondary | ICD-10-CM

## 2021-01-13 DIAGNOSIS — M2041 Other hammer toe(s) (acquired), right foot: Secondary | ICD-10-CM

## 2021-01-13 DIAGNOSIS — M778 Other enthesopathies, not elsewhere classified: Secondary | ICD-10-CM

## 2021-01-13 MED ORDER — TRIAMCINOLONE ACETONIDE 10 MG/ML IJ SUSP
10.0000 mg | Freq: Once | INTRAMUSCULAR | Status: AC
  Administered 2021-01-13: 10 mg

## 2021-01-13 NOTE — Progress Notes (Signed)
Subjective:   Patient ID: Valerie Parker, female   DOB: 65 y.o.   MRN: [id number]   HPI Patient states that she wants to discuss her surgery again and she is getting pain in the left foot and wants to know if I can work on that today   ROS      Objective:  Physical Exam  Neurovascular status intact with patient having a severely dislocated second digit right with chronic inflammation of the joint and is now developing some inflammation fluid around the second MPJ left and needs to wait until the fall to do the right foot due to work schedule     Assessment:  Hammertoe deformity second right flexor plate dislocation with inflammatory capsulitis second MPJ left     Plan:  H&P discussed both conditions and for the right discussed digital fusion shortening osteotomy with fixation.  She wants to have this done and is tentatively scheduled for September for surgery and I educated her again on it and for the left I did sterile prep and did a periarticular injection second MPJ 3 mg Dexasone Kenalog 5 mg Xylocaine

## 2021-02-06 ENCOUNTER — Other Ambulatory Visit: Payer: Self-pay | Admitting: Family Medicine

## 2021-03-03 DIAGNOSIS — K006 Disturbances in tooth eruption: Secondary | ICD-10-CM | POA: Diagnosis not present

## 2021-03-29 ENCOUNTER — Other Ambulatory Visit: Payer: Self-pay | Admitting: Family Medicine

## 2021-03-29 DIAGNOSIS — I10 Essential (primary) hypertension: Secondary | ICD-10-CM

## 2021-04-07 ENCOUNTER — Telehealth: Payer: Self-pay | Admitting: Urology

## 2021-04-07 NOTE — Telephone Encounter (Signed)
DOS - 04/27/21  METATARSAL OSTEOTOMY 2ND RIGHT --- [id number] HAMMERTOE REPAIR 2ND RIGHT --- [id number] INJECTION 2ND LEFT MPJ --- [id number]  [employer] EFFECTIVE DATE - 08/22/20   PER [facility] CPT CODES 82956 X'S 2 AND 21308 HAVE BEEN APPROVED, AUTH # [id number]. CPT CODE 65784 NO PRIOR AUTH IS REQUIRED.

## 2021-04-22 ENCOUNTER — Ambulatory Visit: Payer: Medicare HMO | Admitting: Nurse Practitioner

## 2021-04-23 ENCOUNTER — Ambulatory Visit: Payer: Medicare Other | Admitting: Family Medicine

## 2021-04-23 ENCOUNTER — Ambulatory Visit: Payer: Medicare HMO

## 2021-04-24 ENCOUNTER — Other Ambulatory Visit: Payer: Self-pay

## 2021-04-24 ENCOUNTER — Ambulatory Visit (INDEPENDENT_AMBULATORY_CARE_PROVIDER_SITE_OTHER): Payer: Medicare HMO | Admitting: *Deleted

## 2021-04-24 DIAGNOSIS — Z0000 Encounter for general adult medical examination without abnormal findings: Secondary | ICD-10-CM | POA: Diagnosis not present

## 2021-04-24 NOTE — Progress Notes (Signed)
Subjective:   Valerie Parker is a 65 y.o. female who presents for Medicare Annual (Subsequent) preventive examination.  I connected with  Valerie Parker on [phone] by an audio enabled telemedicine application and verified that I am speaking with the correct person using two identifiers.   I discussed the limitations, risks, security and privacy concerns of performing an evaluation and management service by telephone and the availability of in person appointments. I also discussed with the patient that there may be a patient responsible charge related to this service. The patient expressed understanding and verbally consented to this telephonic visit.  Review of Systems           Objective:    There were no vitals filed for this visit. There is no height or weight on file to calculate BMI.  Advanced Directives 04/22/2020 11/28/2018 08/27/2015  Does Patient Have a Medical Advance Directive? No No No  Would patient like information on creating a medical advance directive? No - Patient declined No - Patient declined No - patient declined information    Current Medications (verified) Outpatient Encounter Medications as of 04/24/2021  Medication Sig   amLODipine (NORVASC) 5 MG tablet Take 1 tablet by mouth once daily   atorvastatin (LIPITOR) 20 MG tablet Take 1 tablet (20 mg total) by mouth daily.   clobetasol cream (TEMOVATE) 0.05 % Use bid for 2 weeks then 2-3 x weekly (Patient not taking: Reported on 12/28/2020)   conjugated estrogens (PREMARIN) vaginal cream Use daily for 2 weeks then 2-3 x weekly (Patient not taking: Reported on 12/28/2020)   lisinopril (ZESTRIL) 40 MG tablet Take 1 tablet by mouth once daily   nitrofurantoin, macrocrystal-monohydrate, (MACROBID) 100 MG capsule Take 1 capsule (100 mg total) by mouth 2 (two) times daily.   VITAMIN D PO Take by mouth.   No facility-administered encounter medications on file as of 04/24/2021.    Allergies (verified) Patient has no known  allergies.   History: Past Medical History:  Diagnosis Date   ABDOMINAL BLOATING 11/18/2008   Qualifier: Diagnosis of  By: [name] MD, [name]     Basal cell carcinoma    on face   Hyperlipidemia    Hypertension    Low back pain    Past Surgical History:  Procedure Laterality Date   CESAREAN SECTION     3 times   COLONOSCOPY  2010   repeat 3 years - polyps   TONSILLECTOMY     Family History  Problem Relation Age of Onset   Cancer Mother 38       alcoholism; throat cancer   Diabetes Mother    Alcohol abuse Mother    COPD Father    Alcohol abuse Father    Heart disease Father    Hypertension Father    Diabetes Sister    Hepatitis C Brother    Hepatitis C Sister    Hepatitis C Sister    Mental illness Daughter    Colon cancer Neg Hx    Social History   Socioeconomic History   Marital status: Widowed    Spouse name: Not on file   Number of children: 3   Years of education: Not on file   Highest education level: Some college, no degree  Occupational History   Occupation: CNA  Tobacco Use   Smoking status: Never   Smokeless tobacco: Never  Vaping Use   Vaping Use: Never used  Substance and Sexual Activity   Alcohol use: No  Alcohol/week: 0.0 standard drinks   Drug use: No   Sexual activity: Not Currently    Birth control/protection: Post-menopausal  Other Topics Concern   Not on file  Social History Narrative   Lives alone now-husband passed in 2015- from heart condition    3 children-live close by; 4 granchildren   4 cats and 1 dog   Enjoys: out and about, grandchildren      Diet: eats all food groups   Caffeine: 1 pot of coffee daily   Water: 1-2 cups daily       Wears seat belt   Does not use phone while driving    Smoke detectors at home.   Social Determinants of Health   Financial Resource Strain: Low Risk    Difficulty of Paying Living Expenses: Not hard at all  Food Insecurity: No Food Insecurity   Worried About Charity fundraiser in the  Last Year: Never true   [place] in the Last Year: Never true  Transportation Needs: No Transportation Needs   Lack of Transportation (Medical): No   Lack of Transportation (Non-Medical): No  Physical Activity: Insufficiently Active   Days of Exercise per Week: 3 days   Minutes of Exercise per Session: 10 min  Stress: No Stress Concern Present   Feeling of Stress : Not at all  Social Connections: Socially Isolated   Frequency of Communication with Friends and Family: More than three times a week   Frequency of Social Gatherings with Friends and Family: More than three times a week   Attends Religious Services: Never   Marine scientist or Organizations: No   Attends Archivist Meetings: Never   Marital Status: Widowed    Tobacco Counseling Counseling given: Not Answered   Clinical Intake:                 Diabetic?No         Activities of Daily Living No flowsheet data found.  Patient Care Team: Noreene Larsson, NP as PCP - General (Nurse Practitioner)  Indicate any recent Medical Services you may have received from other than [facility] providers in the past year (date may be approximate).     Assessment:   This is a routine wellness examination for Valerie Parker.  Hearing/Vision screen No results found.  Dietary issues and exercise activities discussed:     Goals Addressed   None   Depression Screen PHQ 2/9 Scores 12/28/2020 10/20/2020 05/27/2020 04/22/2020 01/29/2020 12/19/2019  PHQ - 2 Score 0 0 0 0 0 0  PHQ- 9 Score - - 0 - 0 -    Fall Risk Fall Risk  12/28/2020 10/20/2020 08/05/2020 05/27/2020 04/22/2020  Falls in the past year? 0 0 0 0 0  Number falls in past yr: 0 0 - 0 0  Injury with Fall? 0 0 - 0 0  Risk for fall due to : No Fall Risks No Fall Risks - - No Fall Risks  Follow up Falls evaluation completed Falls evaluation completed - - Falls evaluation completed    FALL RISK PREVENTION PERTAINING TO THE HOME:  Any stairs in or around the  home? Yes  If so, are there any without handrails? No Home free of loose throw rugs in walkways, pet beds, electrical cords, etc? Yes  Adequate lighting in your home to reduce risk of falls? Yes   ASSISTIVE DEVICES UTILIZED TO PREVENT FALLS:  Life alert? No  Use of a cane, walker or  w/c? No  Grab bars in the bathroom? No  Shower chair or bench in shower? No  Elevated toilet seat or a handicapped toilet? No   TIMED UP AND GO:  Was the test performed? No .  Length of time to ambulate 10 feet: NA sec.     Cognitive Function:     6CIT Screen 04/22/2020  What Year? 0 points  What month? 0 points  What time? 0 points  Count back from 20 0 points  Months in reverse 0 points  Repeat phrase 0 points  Total Score 0    Immunizations Immunization History  Administered Date(s) Administered   Influenza Whole 05/22/2008   Influenza-Unspecified 05/14/2018   Td 08/23/2007    TDAP status: Due, Education has been provided regarding the importance of this vaccine. Advised may receive this vaccine at local pharmacy or Health Dept. Aware to provide a copy of the vaccination record if obtained from local pharmacy or Health Dept. Verbalized acceptance and understanding.  Flu Vaccine status: Due, Education has been provided regarding the importance of this vaccine. Advised may receive this vaccine at local pharmacy or Health Dept. Aware to provide a copy of the vaccination record if obtained from local pharmacy or Health Dept. Verbalized acceptance and understanding.  Pneumococcal vaccine status: Up to date  Covid 19 Vaccination Status:   Qualifies for Shingles Vaccine? Yes   Zostavax completed No   Shingrix Completed?: No.    Education has been provided regarding the importance of this vaccine. Patient has been advised to call insurance company to determine out of pocket expense if they have not yet received this vaccine. Advised may also receive vaccine at local pharmacy or Health Dept.  Verbalized acceptance and understanding.  Screening Tests Health Maintenance  Topic Date Due   COVID-19 Vaccine (1) Never done   Zoster Vaccines- Shingrix (1 of 2) Never done   TETANUS/TDAP  08/22/2017   PNA vac Low Risk Adult (1 of 2 - PCV13) Never done   INFLUENZA VACCINE  03/22/2021   MAMMOGRAM  05/11/2022   DEXA SCAN  05/11/2022   COLONOSCOPY (Pts 45-64yr Insurance coverage will need to be confirmed)  09/08/2025   Hepatitis C Screening  Completed   HPV VACCINES  Aged Out    Health Maintenance  Health Maintenance Due  Topic Date Due   COVID-19 Vaccine (1) Never done   Zoster Vaccines- Shingrix (1 of 2) Never done   TETANUS/TDAP  08/22/2017   PNA vac Low Risk Adult (1 of 2 - PCV13) Never done   INFLUENZA VACCINE  03/22/2021    Colorectal cancer screening: Type of screening: Colonoscopy. Completed 09-09-15. Repeat every 10 years  Mammogram status: Completed 05-11-22. Repeat every year  Bone Density status: Completed 05-11-20. Results reflect: Bone density results: OSTEOPENIA. Repeat every 2 years.  Lung Cancer Screening: (Low Dose CT Chest recommended if Age 66-80years, 30 pack-year currently smoking OR have quit w/in 15years.) does not qualify.   Lung Cancer Screening Referral: NA  Additional Screening:  Hepatitis C Screening: does qualify; Completed 09-15-14  Vision Screening: Recommended annual ophthalmology exams for early detection of glaucoma and other disorders of the eye. Is the patient up to date with their annual eye exam?  Yes  Who is the provider or what is the name of the office in which the patient attends annual eye exams? [facility] If pt is not established with a provider, would they like to be referred to a provider to establish  care? No .   Dental Screening: Recommended annual dental exams for proper oral hygiene  Community Resource Referral / Chronic Care Management: CRR required this visit?  No   CCM required this visit?  No       Plan:     I have personally reviewed and noted the following in the patient's chart:   Medical and social history Use of alcohol, tobacco or illicit drugs  Current medications and supplements including opioid prescriptions.  Functional ability and status Nutritional status Physical activity Advanced directives List of other physicians Hospitalizations, surgeries, and ER visits in previous 12 months Vitals Screenings to include cognitive, depression, and falls Referrals and appointments  In addition, I have reviewed and discussed with patient certain preventive protocols, quality metrics, and best practice recommendations. A written personalized care plan for preventive services as well as general preventive health recommendations were provided to patient.     Shelda Altes, CMA   04/24/2021   Nurse Notes: This was a telehealth visit. The patient was at home. The provider was at home and was Demetrius Revel, NP.

## 2021-04-24 NOTE — Patient Instructions (Signed)
Valerie Parker , Thank you for taking time to come for your Medicare Wellness Visit. I appreciate your ongoing commitment to your health goals. Please review the following plan we discussed and let me know if I can assist you in the future.   Screening recommendations/referrals: Colonoscopy: Due 09-08-25 Mammogram: Due 05-11-22  Bone Density: Due 05-11-22 Recommended yearly ophthalmology/optometry visit for glaucoma screening and checkup Recommended yearly dental visit for hygiene and checkup  Vaccinations: Influenza vaccine: Due now Pneumococcal vaccine: Due now Tdap vaccine: Due now Shingles vaccine: Due now    Advanced directives: Patient declined information  Conditions/risks identified: Hypertension  Next appointment: 1 year    Preventive Care 66 Years and Older, Female Preventive care refers to lifestyle choices and visits with your health care provider that can promote health and wellness. What does preventive care include? A yearly physical exam. This is also called an annual well check. Dental exams once or twice a year. Routine eye exams. Ask your health care provider how often you should have your eyes checked. Personal lifestyle choices, including: Daily care of your teeth and gums. Regular physical activity. Eating a healthy diet. Avoiding tobacco and drug use. Limiting alcohol use. Practicing safe sex. Taking low-dose aspirin every day. Taking vitamin and mineral supplements as recommended by your health care provider. What happens during an annual well check? The services and screenings done by your health care provider during your annual well check will depend on your age, overall health, lifestyle risk factors, and family history of disease. Counseling  Your health care provider may ask you questions about your: Alcohol use. Tobacco use. Drug use. Emotional well-being. Home and relationship well-being. Sexual activity. Eating habits. History of  falls. Memory and ability to understand (cognition). Work and work Statistician. Reproductive health. Screening  You may have the following tests or measurements: Height, weight, and BMI. Blood pressure. Lipid and cholesterol levels. These may be checked every 5 years, or more frequently if you are over 66 years old. Skin check. Lung cancer screening. You may have this screening every year starting at age 66 if you have a 30-pack-year history of smoking and currently smoke or have quit within the past 15 years. Fecal occult blood test (FOBT) of the stool. You may have this test every year starting at age 66. Flexible sigmoidoscopy or colonoscopy. You may have a sigmoidoscopy every 5 years or a colonoscopy every 10 years starting at age 66. Hepatitis C blood test. Hepatitis B blood test. Sexually transmitted disease (STD) testing. Diabetes screening. This is done by checking your blood sugar (glucose) after you have not eaten for a while (fasting). You may have this done every 1-3 years. Bone density scan. This is done to screen for osteoporosis. You may have this done starting at age 66. Mammogram. This may be done every 1-2 years. Talk to your health care provider about how often you should have regular mammograms. Talk with your health care provider about your test results, treatment options, and if necessary, the need for more tests. Vaccines  Your health care provider may recommend certain vaccines, such as: Influenza vaccine. This is recommended every year. Tetanus, diphtheria, and acellular pertussis (Tdap, Td) vaccine. You may need a Td booster every 10 years. Zoster vaccine. You may need this after age 66. Pneumococcal 13-valent conjugate (PCV13) vaccine. One dose is recommended after age 66. Pneumococcal polysaccharide (PPSV23) vaccine. One dose is recommended after age 66. Talk to your health care provider about which screenings and vaccines  you need and how often you need  them. This information is not intended to replace advice given to you by your health care provider. Make sure you discuss any questions you have with your health care provider. Document Released: 09/04/2015 Document Revised: 04/27/2016 Document Reviewed: 06/09/2015 Elsevier Interactive Patient Education  2017 [place] Prevention in the Home Falls can cause injuries. They can happen to people of all ages. There are many things you can do to make your home safe and to help prevent falls. What can I do on the outside of my home? Regularly fix the edges of walkways and driveways and fix any cracks. Remove anything that might make you trip as you walk through a door, such as a raised step or threshold. Trim any bushes or trees on the path to your home. Use bright outdoor lighting. Clear any walking paths of anything that might make someone trip, such as rocks or tools. Regularly check to see if handrails are loose or broken. Make sure that both sides of any steps have handrails. Any raised decks and porches should have guardrails on the edges. Have any leaves, snow, or ice cleared regularly. Use sand or salt on walking paths during winter. Clean up any spills in your garage right away. This includes oil or grease spills. What can I do in the bathroom? Use night lights. Install grab bars by the toilet and in the tub and shower. Do not use towel bars as grab bars. Use non-skid mats or decals in the tub or shower. If you need to sit down in the shower, use a plastic, non-slip stool. Keep the floor dry. Clean up any water that spills on the floor as soon as it happens. Remove soap buildup in the tub or shower regularly. Attach bath mats securely with double-sided non-slip rug tape. Do not have throw rugs and other things on the floor that can make you trip. What can I do in the bedroom? Use night lights. Make sure that you have a light by your bed that is easy to reach. Do not use  any sheets or blankets that are too big for your bed. They should not hang down onto the floor. Have a firm chair that has side arms. You can use this for support while you get dressed. Do not have throw rugs and other things on the floor that can make you trip. What can I do in the kitchen? Clean up any spills right away. Avoid walking on wet floors. Keep items that you use a lot in easy-to-reach places. If you need to reach something above you, use a strong step stool that has a grab bar. Keep electrical cords out of the way. Do not use floor polish or wax that makes floors slippery. If you must use wax, use non-skid floor wax. Do not have throw rugs and other things on the floor that can make you trip. What can I do with my stairs? Do not leave any items on the stairs. Make sure that there are handrails on both sides of the stairs and use them. Fix handrails that are broken or loose. Make sure that handrails are as long as the stairways. Check any carpeting to make sure that it is firmly attached to the stairs. Fix any carpet that is loose or worn. Avoid having throw rugs at the top or bottom of the stairs. If you do have throw rugs, attach them to the floor with carpet tape. Make sure  that you have a light switch at the top of the stairs and the bottom of the stairs. If you do not have them, ask someone to add them for you. What else can I do to help prevent falls? Wear shoes that: Do not have high heels. Have rubber bottoms. Are comfortable and fit you well. Are closed at the toe. Do not wear sandals. If you use a stepladder: Make sure that it is fully opened. Do not climb a closed stepladder. Make sure that both sides of the stepladder are locked into place. Ask someone to hold it for you, if possible. Clearly mark and make sure that you can see: Any grab bars or handrails. First and last steps. Where the edge of each step is. Use tools that help you move around (mobility aids)  if they are needed. These include: Canes. Walkers. Scooters. Crutches. Turn on the lights when you go into a dark area. Replace any light bulbs as soon as they burn out. Set up your furniture so you have a clear path. Avoid moving your furniture around. If any of your floors are uneven, fix them. If there are any pets around you, be aware of where they are. Review your medicines with your doctor. Some medicines can make you feel dizzy. This can increase your chance of falling. Ask your doctor what other things that you can do to help prevent falls. This information is not intended to replace advice given to you by your health care provider. Make sure you discuss any questions you have with your health care provider. Document Released: 06/04/2009 Document Revised: 01/14/2016 Document Reviewed: 09/12/2014 Elsevier Interactive Patient Education  2017 [employer].

## 2021-04-26 MED ORDER — HYDROCODONE-ACETAMINOPHEN 10-325 MG PO TABS: 1.0000 | ORAL_TABLET | Freq: Four times a day (QID) | ORAL | 0 refills | Status: AC | PRN

## 2021-04-26 NOTE — Addendum Note (Signed)
Addended by: Wallene Huh on: 04/26/2021 08:49 PM   Modules accepted: Orders

## 2021-04-28 ENCOUNTER — Telehealth: Payer: Self-pay

## 2021-04-28 NOTE — Telephone Encounter (Signed)
Valerie Parker called to cancel her surgery with Dr. Paulla Dolly on 04/27/2021. She stated the surgery center called her twice yesterday to delay her start time. Her ride was not able to accommodate the change in time. She stated she will call me back to reschedule once she can find a ride.

## 2021-05-03 ENCOUNTER — Encounter: Payer: Medicare HMO | Admitting: Podiatry

## 2021-05-05 ENCOUNTER — Other Ambulatory Visit: Payer: Self-pay | Admitting: Nurse Practitioner

## 2021-05-11 ENCOUNTER — Ambulatory Visit (INDEPENDENT_AMBULATORY_CARE_PROVIDER_SITE_OTHER): Payer: Medicare HMO | Admitting: Nurse Practitioner

## 2021-05-11 ENCOUNTER — Other Ambulatory Visit: Payer: Self-pay

## 2021-05-11 ENCOUNTER — Encounter: Payer: Self-pay | Admitting: Nurse Practitioner

## 2021-05-11 VITALS — BP 144/82 | HR 76 | Temp 98.3°F | Ht 63.0 in | Wt 149.0 lb

## 2021-05-11 DIAGNOSIS — R7301 Impaired fasting glucose: Secondary | ICD-10-CM

## 2021-05-11 DIAGNOSIS — E7800 Pure hypercholesterolemia, unspecified: Secondary | ICD-10-CM | POA: Diagnosis not present

## 2021-05-11 DIAGNOSIS — I10 Essential (primary) hypertension: Secondary | ICD-10-CM | POA: Diagnosis not present

## 2021-05-11 DIAGNOSIS — S81812A Laceration without foreign body, left lower leg, initial encounter: Secondary | ICD-10-CM

## 2021-05-11 DIAGNOSIS — Z23 Encounter for immunization: Secondary | ICD-10-CM

## 2021-05-11 MED ORDER — BLOOD PRESSURE CUFF MISC: 1.0000 | Freq: Every day | 0 refills | Status: DC

## 2021-05-11 NOTE — Assessment & Plan Note (Addendum)
-  healing well -occurred Labor Day with garden shears -tetanus shot today

## 2021-05-11 NOTE — Assessment & Plan Note (Signed)
BP Readings from Last 3 Encounters:  05/11/21 (!) 144/82  12/28/20 133/79  10/20/20 (!) 150/84   -BP slightly elevated -check BP at home; notify office if > 140/90 consistently -Rx. BP cuff

## 2021-05-11 NOTE — Assessment & Plan Note (Signed)
-  checking labs today 

## 2021-05-11 NOTE — Assessment & Plan Note (Signed)
-  check A1c with labs

## 2021-05-11 NOTE — Progress Notes (Signed)
Established Patient Office Visit  Subjective:  Patient ID: Valerie Parker, female    DOB: 03-Mar-1956  Age: 65 y.o. MRN: [id number]  CC:  Chief Complaint  Patient presents with   Follow-up    HPI Valerie Parker presents for lab follow-up for HTN and HLD. At her last OV, her LDL was 138.  No adverse medication effects.  She has a left leg laceration that occurred on Labor Day from cutting herself with garden shears. Healing well, but she hasn't had a recent tetanus shot.  Past Medical History:  Diagnosis Date   ABDOMINAL BLOATING 11/18/2008   Qualifier: Diagnosis of  By: [name] MD, [name]     Basal cell carcinoma    on face   Hyperlipidemia    Hypertension    Lichen sclerosus et atrophicus 05/27/2020   Low back pain     Past Surgical History:  Procedure Laterality Date   CESAREAN SECTION     3 times   COLONOSCOPY  2010   repeat 3 years - polyps   TONSILLECTOMY      Family History  Problem Relation Age of Onset   Cancer Mother 47       alcoholism; throat cancer   Diabetes Mother    Alcohol abuse Mother    COPD Father    Alcohol abuse Father    Heart disease Father    Hypertension Father    Diabetes Sister    Hepatitis C Brother    Hepatitis C Sister    Hepatitis C Sister    Mental illness Daughter    Colon cancer Neg Hx     Social History   Socioeconomic History   Marital status: Widowed    Spouse name: Not on file   Number of children: 3   Years of education: Not on file   Highest education level: Some college, no degree  Occupational History   Occupation: CNA  Tobacco Use   Smoking status: Never   Smokeless tobacco: Never  Vaping Use   Vaping Use: Never used  Substance and Sexual Activity   Alcohol use: No    Alcohol/week: 0.0 standard drinks   Drug use: No   Sexual activity: Not Currently    Birth control/protection: Post-menopausal  Other Topics Concern   Not on file  Social History Narrative   Lives alone now-husband passed in 2015-  from heart condition    3 children-live close by; 4 granchildren   4 cats and 1 dog   Enjoys: out and about, grandchildren      Diet: eats all food groups   Caffeine: 1 pot of coffee daily   Water: 1-2 cups daily       Wears seat belt   Does not use phone while driving    Smoke detectors at home.   Social Determinants of Health   Financial Resource Strain: Low Risk    Difficulty of Paying Living Expenses: Not hard at all  Food Insecurity: No Food Insecurity   Worried About Charity fundraiser in the Last Year: Never true   [place] in the Last Year: Never true  Transportation Needs: No Transportation Needs   Lack of Transportation (Medical): No   Lack of Transportation (Non-Medical): No  Physical Activity: Insufficiently Active   Days of Exercise per Week: 2 days   Minutes of Exercise per Session: 30 min  Stress: No Stress Concern Present   Feeling of Stress : Not at all  Social  Connections: Moderately Isolated   Frequency of Communication with Friends and Family: More than three times a week   Frequency of Social Gatherings with Friends and Family: More than three times a week   Attends Religious Services: More than 4 times per year   Active Member of [employer] or Organizations: No   Attends Archivist Meetings: Never   Marital Status: Widowed  Human resources officer Violence: Not At Risk   Fear of Current or Ex-Partner: No   Emotionally Abused: No   Physically Abused: No   Sexually Abused: No    Outpatient Medications Prior to Visit  Medication Sig Dispense Refill   amLODipine (NORVASC) 5 MG tablet Take 1 tablet by mouth once daily 90 tablet 0   atorvastatin (LIPITOR) 20 MG tablet Take 1 tablet (20 mg total) by mouth daily. 90 tablet 3   clobetasol cream (TEMOVATE) 0.05 % Use bid for 2 weeks then 2-3 x weekly 45 g 2   conjugated estrogens (PREMARIN) vaginal cream Use daily for 2 weeks then 2-3 x weekly 24 g 12   lisinopril (ZESTRIL) 40 MG tablet Take 1 tablet  by mouth once daily 90 tablet 0   nitrofurantoin, macrocrystal-monohydrate, (MACROBID) 100 MG capsule Take 1 capsule (100 mg total) by mouth 2 (two) times daily. (Patient not taking: Reported on 05/11/2021) 10 capsule 0   VITAMIN D PO Take by mouth. (Patient not taking: Reported on 05/11/2021)     No facility-administered medications prior to visit.    No Known Allergies  ROS Review of Systems  Constitutional: Negative.   Respiratory: Negative.    Cardiovascular: Negative.   Skin:  Positive for wound.       Left lower leg laceration, healing well  Psychiatric/Behavioral: Negative.       Objective:    Physical Exam Constitutional:      Appearance: Normal appearance.  Cardiovascular:     Rate and Rhythm: Normal rate and regular rhythm.     Pulses: Normal pulses.     Heart sounds: Normal heart sounds.  Pulmonary:     Effort: Pulmonary effort is normal.     Breath sounds: Normal breath sounds.  Musculoskeletal:        General: Normal range of motion.  Skin:    Comments: Left lower leg laceration; healing well without sign of infection  Neurological:     Mental Status: She is alert.  Psychiatric:        Mood and Affect: Mood normal.        Behavior: Behavior normal.        Thought Content: Thought content normal.        Judgment: Judgment normal.    BP (!) 144/82 (BP Location: Left Arm, Patient Position: Sitting, Cuff Size: Normal)   Pulse 76   Temp 98.3 F (36.8 C) (Oral)   Ht 5' 3"  (1.6 m)   Wt 149 lb (67.6 kg)   SpO2 95%   BMI 26.39 kg/m  Wt Readings from Last 3 Encounters:  05/11/21 149 lb (67.6 kg)  12/28/20 149 lb (67.6 kg)  10/20/20 148 lb (67.1 kg)     Health Maintenance Due  Topic Date Due   Zoster Vaccines- Shingrix (1 of 2) Never done    There are no preventive care reminders to display for this patient.  Lab Results  Component Value Date   TSH 1.64 04/08/2009   Lab Results  Component Value Date   WBC 5.0 10/20/2020   HGB 13.8 10/20/2020  HCT 40.7 10/20/2020   MCV 89 10/20/2020   PLT 232 10/20/2020   Lab Results  Component Value Date   NA 142 10/20/2020   K 5.0 10/20/2020   CO2 24 10/20/2020   GLUCOSE 95 10/20/2020   BUN 14 10/20/2020   CREATININE 0.77 10/20/2020   BILITOT 0.6 10/20/2020   ALKPHOS 76 10/20/2020   AST 18 10/20/2020   ALT 19 10/20/2020   PROT 6.9 10/20/2020   ALBUMIN 4.4 10/20/2020   CALCIUM 9.7 10/20/2020   ANIONGAP 5 11/28/2018   EGFR 86 10/20/2020   Lab Results  Component Value Date   CHOL 199 10/20/2020   Lab Results  Component Value Date   HDL 53 10/20/2020   Lab Results  Component Value Date   LDLCALC 138 (H) 10/20/2020   Lab Results  Component Value Date   TRIG 41 10/20/2020   Lab Results  Component Value Date   CHOLHDL 3.6 10/17/2017   No results found for: HGBA1C    Assessment & Plan:   Problem List Items Addressed This Visit       Cardiovascular and Mediastinum   Essential hypertension - Primary    BP Readings from Last 3 Encounters:  05/11/21 (!) 144/82  12/28/20 133/79  10/20/20 (!) 150/84  -BP slightly elevated -check BP at home; notify office if > 140/90 consistently -Rx. BP cuff      Relevant Medications   Blood Pressure Monitoring (BLOOD PRESSURE CUFF) MISC   Other Relevant Orders   CBC with Differential/Platelet   CMP14+EGFR   Lipid Panel With LDL/HDL Ratio     Endocrine   Impaired fasting glucose    -check A1c with labs      Relevant Orders   Hemoglobin A1c     Other   HYPERCHOLESTEROLEMIA    -checking labs today      Relevant Orders   Lipid Panel With LDL/HDL Ratio   Leg laceration, left, initial encounter    -healing well -occurred Labor Day with garden shears -tetanus shot today       Meds ordered this encounter  Medications   Blood Pressure Monitoring (BLOOD PRESSURE CUFF) MISC    Sig: 1 Act by Does not apply route daily.    Dispense:  1 each    Refill:  0    Please help her select the correct size    Follow-up:  Return in about 6 months (around 11/08/2021) for Lab follow-up (HTN, HLD)- same-day fasting labs.    Noreene Larsson, NP

## 2021-05-11 NOTE — Patient Instructions (Signed)
Please have fasting labs drawn today. 

## 2021-05-11 NOTE — Addendum Note (Signed)
Addended by: Laretta Bolster on: 05/11/2021 09:36 AM   Modules accepted: Orders

## 2021-06-07 DIAGNOSIS — E7800 Pure hypercholesterolemia, unspecified: Secondary | ICD-10-CM | POA: Diagnosis not present

## 2021-06-07 DIAGNOSIS — I10 Essential (primary) hypertension: Secondary | ICD-10-CM | POA: Diagnosis not present

## 2021-06-07 DIAGNOSIS — R7301 Impaired fasting glucose: Secondary | ICD-10-CM | POA: Diagnosis not present

## 2021-06-08 LAB — HEMOGLOBIN A1C
Est. average glucose Bld gHb Est-mCnc: 111 mg/dL
Hgb A1c MFr Bld: 5.5 % (ref 4.8–5.6)

## 2021-06-08 LAB — CBC WITH DIFFERENTIAL/PLATELET
Basophils Absolute: 0 10*3/uL (ref 0.0–0.2)
Basos: 1 %
EOS (ABSOLUTE): 0.1 10*3/uL (ref 0.0–0.4)
Eos: 2 %
Hematocrit: 41.8 % (ref 34.0–46.6)
Hemoglobin: 14.1 g/dL (ref 11.1–15.9)
Immature Grans (Abs): 0 10*3/uL (ref 0.0–0.1)
Immature Granulocytes: 0 %
Lymphocytes Absolute: 1.4 10*3/uL (ref 0.7–3.1)
Lymphs: 30 %
MCH: 30.4 pg (ref 26.6–33.0)
MCHC: 33.7 g/dL (ref 31.5–35.7)
MCV: 90 fL (ref 79–97)
Monocytes Absolute: 0.5 10*3/uL (ref 0.1–0.9)
Monocytes: 10 %
Neutrophils Absolute: 2.7 10*3/uL (ref 1.4–7.0)
Neutrophils: 57 %
Platelets: 227 10*3/uL (ref 150–450)
RBC: 4.64 x10E6/uL (ref 3.77–5.28)
RDW: 12.2 % (ref 11.7–15.4)
WBC: 4.6 10*3/uL (ref 3.4–10.8)

## 2021-06-08 LAB — CMP14+EGFR
ALT: 12 IU/L (ref 0–32)
AST: 16 IU/L (ref 0–40)
Albumin/Globulin Ratio: 1.8 (ref 1.2–2.2)
Albumin: 4.5 g/dL (ref 3.8–4.8)
Alkaline Phosphatase: 84 IU/L (ref 44–121)
BUN/Creatinine Ratio: 17 (ref 12–28)
BUN: 13 mg/dL (ref 8–27)
Bilirubin Total: 0.7 mg/dL (ref 0.0–1.2)
CO2: 25 mmol/L (ref 20–29)
Calcium: 9.4 mg/dL (ref 8.7–10.3)
Chloride: 104 mmol/L (ref 96–106)
Creatinine, Ser: 0.77 mg/dL (ref 0.57–1.00)
Globulin, Total: 2.5 g/dL (ref 1.5–4.5)
Glucose: 99 mg/dL (ref 70–99)
Potassium: 4.1 mmol/L (ref 3.5–5.2)
Sodium: 142 mmol/L (ref 134–144)
Total Protein: 7 g/dL (ref 6.0–8.5)
eGFR: 85 mL/min/{1.73_m2} (ref >59)

## 2021-06-08 LAB — LIPID PANEL WITH LDL/HDL RATIO
Cholesterol, Total: 162 mg/dL (ref 100–199)
HDL: 52 mg/dL (ref >39)
LDL Chol Calc (NIH): 99 mg/dL (ref 0–99)
LDL/HDL Ratio: 1.9 ratio (ref 0.0–3.2)
Triglycerides: 53 mg/dL (ref 0–149)
VLDL Cholesterol Cal: 11 mg/dL (ref 5–40)

## 2021-06-29 ENCOUNTER — Other Ambulatory Visit: Payer: Self-pay | Admitting: Family Medicine

## 2021-06-29 DIAGNOSIS — I10 Essential (primary) hypertension: Secondary | ICD-10-CM

## 2021-06-30 ENCOUNTER — Other Ambulatory Visit: Payer: Self-pay | Admitting: Adult Health

## 2021-06-30 MED ORDER — NYSTATIN-TRIAMCINOLONE 100000-0.1 UNIT/GM-% EX OINT: 1.0000 "application " | TOPICAL_OINTMENT | Freq: Two times a day (BID) | CUTANEOUS | 0 refills | Status: DC

## 2021-06-30 NOTE — Progress Notes (Signed)
Will rx mycolog

## 2021-08-21 ENCOUNTER — Other Ambulatory Visit: Payer: Self-pay | Admitting: Nurse Practitioner

## 2021-08-27 ENCOUNTER — Other Ambulatory Visit: Payer: Self-pay | Admitting: Nurse Practitioner

## 2021-08-27 DIAGNOSIS — I10 Essential (primary) hypertension: Secondary | ICD-10-CM

## 2021-10-11 ENCOUNTER — Encounter: Payer: Self-pay | Admitting: Nurse Practitioner

## 2021-10-11 ENCOUNTER — Other Ambulatory Visit: Payer: Self-pay

## 2021-10-11 ENCOUNTER — Ambulatory Visit (INDEPENDENT_AMBULATORY_CARE_PROVIDER_SITE_OTHER): Payer: Medicare HMO | Admitting: Nurse Practitioner

## 2021-10-11 VITALS — BP 118/77 | HR 75 | Ht 63.0 in | Wt 147.1 lb

## 2021-10-11 DIAGNOSIS — M2041 Other hammer toe(s) (acquired), right foot: Secondary | ICD-10-CM

## 2021-10-11 DIAGNOSIS — I10 Essential (primary) hypertension: Secondary | ICD-10-CM | POA: Diagnosis not present

## 2021-10-11 DIAGNOSIS — L989 Disorder of the skin and subcutaneous tissue, unspecified: Secondary | ICD-10-CM | POA: Diagnosis not present

## 2021-10-11 DIAGNOSIS — M2042 Other hammer toe(s) (acquired), left foot: Secondary | ICD-10-CM

## 2021-10-11 DIAGNOSIS — E663 Overweight: Secondary | ICD-10-CM | POA: Diagnosis not present

## 2021-10-11 DIAGNOSIS — E7800 Pure hypercholesterolemia, unspecified: Secondary | ICD-10-CM | POA: Diagnosis not present

## 2021-10-11 MED ORDER — BLOOD PRESSURE CUFF MISC: 1.0000 | Freq: Every day | 0 refills | Status: DC

## 2021-10-11 NOTE — Assessment & Plan Note (Signed)
Lab Results  Component Value Date   CHOL 162 06/07/2021   HDL 52 06/07/2021   LDLCALC 99 06/07/2021   TRIG 53 06/07/2021   CHOLHDL 3.6 10/17/2017   Takes atorvastatin 20mg ,  Lipid panel today Avoid fried fatty foods,

## 2021-10-11 NOTE — Assessment & Plan Note (Signed)
Wt Readings from Last 3 Encounters:  10/11/21 147 lb 1.9 oz (66.7 kg)  05/11/21 149 lb (67.6 kg)  12/28/20 149 lb (67.6 kg)  engage in regular vigorous exercise 30 minutes 5 days a week, eat more of vegetables and protein.

## 2021-10-11 NOTE — Assessment & Plan Note (Signed)
BP Readings from Last 3 Encounters:  10/11/21 118/77  05/11/21 (!) 144/82  12/28/20 133/79  takes amlodipine 81m daily, lisinopril 438mdaily.  BP well controlled today Continue current medications, She never got her BP cuff,  Order for BP cuff resent today.  CMP+EGFR today

## 2021-10-11 NOTE — Assessment & Plan Note (Signed)
Recently noticed about 3 weeks ago, normal skin color, no drainage noted, skin intact  Has history of skin cancer Pt will monitor site for changes in size, color and report  Will refer to dermatology if lesions gets bigger, or changes in color

## 2021-10-11 NOTE — Patient Instructions (Addendum)
Please get your shingles and pneumonia vaccine at your pharmacy Please get your fasting blood work done as discussed  It is important that you exercise regularly at least 30 minutes 5 times a week.  Think about what you will eat, plan ahead. Choose " clean, green, fresh or frozen" over canned, processed or packaged foods which are more sugary, salty and fatty. 70 to 75% of food eaten should be vegetables and fruit. Three meals at set times with snacks allowed between meals, but they must be fruit or vegetables. Aim to eat over a 12 hour period , example 7 am to 7 pm, and STOP after  your last meal of the day. Drink water,generally about 64 ounces per day, no other drink is as healthy. Fruit juice is best enjoyed in a healthy way, by EATING the fruit.  Thanks for choosing [facility], we consider it a privelige to serve you.

## 2021-10-11 NOTE — Progress Notes (Signed)
° °  Valerie Parker     MRN: [id number]      DOB: Aug 14, 1956   HPI Valerie Parker is here for follow up and re-evaluation of chronic medical conditions, medication management and review of any available recent lab and radiology data.  Preventive health is updated, specifically  Cancer screening and Immunization.   Questions or concerns regarding consultations or procedures which the PT has had in the interim are  addressed. The PT denies any adverse reactions to current medications since the last visit.     Pt c/o of a little rash on her right arm , she first noticed this 3 weeks ago, she has history of skin cancer and feels like this might be skin cancer. States that it usually starts this way. Its getting bigger but not rapidly bigger, pt denies pain, bleeding discoloration discharge.    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   Pt is due for shingles vaccine, pneumonia vaccine, pt encouraged to get both vaccines at her pharmacy she verbalized understanding.   She cancelled her foot surgery due to work, has hammer toes on both foot. She will get some bills paid off first and then she will reschedule her surgery, she has steroid injection  last year and she is not currently having any pains.  Due for mammogram, pt would like to wait till next appointment before we place an order for this, denies breast pain, masses  ROS Denies recent fever or chills. Denies sinus pressure, nasal congestion, ear pain or sore throat. Denies chest congestion, productive cough or wheezing. Denies chest pains, palpitations and leg swelling Denies abdominal pain, nausea, vomiting,diarrhea or constipation.   Denies dysuria, frequency, hesitancy or incontinence. Denies joint pain, swelling and limitation in mobility. Denies headaches, seizures, numbness, or tingling. Denies depression, anxiety or insomnia. Has a bump on  right arm close to the bend.    PE  BP 118/77    Pulse 75    Ht 5\' 3"  (1.6 m)    Wt 147 lb 1.9 oz (66.7 kg)    SpO2 94%    BMI 26.06 kg/m   Patient alert and oriented and in no cardiopulmonary distress.   Chest: Clear to auscultation bilaterally.  CVS: S1, S2 no murmurs, no S3.Regular rate.  ABD: Soft non tender.   Ext: No edema  MS: Adequate ROM spine, shoulders, hips and knees. Hammer toes on bilateral 2nd toes  Skin: skin lesion noted on right arm towards the [facility] area, skin Intact, no ulcerations noted, no bleeding or discharge.   Psych: Good eye contact, normal affect. Memory intact not anxious or depressed appearing.     Assessment & Plan

## 2021-10-11 NOTE — Assessment & Plan Note (Signed)
Chronic condition, second toes on bilateral foot. Pt denies pain today , had steroid injection last year She cancelled her foot surgery due to issues with her work She plans to reschedule surgery next year.  Able to do ROM of toes

## 2021-10-12 DIAGNOSIS — E7800 Pure hypercholesterolemia, unspecified: Secondary | ICD-10-CM | POA: Diagnosis not present

## 2021-10-12 DIAGNOSIS — I10 Essential (primary) hypertension: Secondary | ICD-10-CM | POA: Diagnosis not present

## 2021-10-13 ENCOUNTER — Encounter: Payer: Self-pay | Admitting: Nurse Practitioner

## 2021-10-13 LAB — CMP14+EGFR
ALT: 11 IU/L (ref 0–32)
AST: 16 IU/L (ref 0–40)
Albumin/Globulin Ratio: 1.8 (ref 1.2–2.2)
Albumin: 4.2 g/dL (ref 3.8–4.8)
Alkaline Phosphatase: 80 IU/L (ref 44–121)
BUN/Creatinine Ratio: 17 (ref 12–28)
BUN: 11 mg/dL (ref 8–27)
Bilirubin Total: 0.3 mg/dL (ref 0.0–1.2)
CO2: 27 mmol/L (ref 20–29)
Calcium: 9.3 mg/dL (ref 8.7–10.3)
Chloride: 108 mmol/L — ABNORMAL HIGH (ref 96–106)
Creatinine, Ser: 0.66 mg/dL (ref 0.57–1.00)
Globulin, Total: 2.3 g/dL (ref 1.5–4.5)
Glucose: 101 mg/dL — ABNORMAL HIGH (ref 70–99)
Potassium: 4.9 mmol/L (ref 3.5–5.2)
Sodium: 146 mmol/L — ABNORMAL HIGH (ref 134–144)
Total Protein: 6.5 g/dL (ref 6.0–8.5)
eGFR: 97 mL/min/{1.73_m2} (ref >59)

## 2021-10-13 LAB — LIPID PANEL
Chol/HDL Ratio: 3.1 ratio (ref 0.0–4.4)
Cholesterol, Total: 159 mg/dL (ref 100–199)
HDL: 51 mg/dL (ref >39)
LDL Chol Calc (NIH): 96 mg/dL (ref 0–99)
Triglycerides: 59 mg/dL (ref 0–149)
VLDL Cholesterol Cal: 12 mg/dL (ref 5–40)

## 2021-10-13 NOTE — Progress Notes (Signed)
Sodium is slightly elevated, drink plenty of fluids to stay hydrated , avoid salt , will recheck labs at her next visit.  Lipid  panel is stable   Thanks

## 2021-11-09 ENCOUNTER — Ambulatory Visit: Payer: Medicare HMO | Admitting: Nurse Practitioner

## 2021-12-21 ENCOUNTER — Other Ambulatory Visit: Payer: Self-pay | Admitting: Internal Medicine

## 2021-12-21 DIAGNOSIS — I10 Essential (primary) hypertension: Secondary | ICD-10-CM

## 2022-04-08 ENCOUNTER — Ambulatory Visit: Payer: Medicare HMO | Admitting: Nurse Practitioner

## 2022-04-11 ENCOUNTER — Ambulatory Visit: Payer: Medicare HMO | Admitting: Nurse Practitioner

## 2022-04-12 ENCOUNTER — Ambulatory Visit: Payer: Medicare HMO | Admitting: Nurse Practitioner

## 2022-04-27 ENCOUNTER — Other Ambulatory Visit: Payer: Self-pay

## 2022-04-27 DIAGNOSIS — Z1231 Encounter for screening mammogram for malignant neoplasm of breast: Secondary | ICD-10-CM

## 2022-05-17 ENCOUNTER — Ambulatory Visit (INDEPENDENT_AMBULATORY_CARE_PROVIDER_SITE_OTHER): Payer: Medicare HMO | Admitting: Nurse Practitioner

## 2022-05-17 ENCOUNTER — Encounter: Payer: Self-pay | Admitting: Nurse Practitioner

## 2022-05-17 DIAGNOSIS — Z0000 Encounter for general adult medical examination without abnormal findings: Secondary | ICD-10-CM | POA: Diagnosis not present

## 2022-05-17 DIAGNOSIS — Z780 Asymptomatic menopausal state: Secondary | ICD-10-CM

## 2022-05-17 NOTE — Patient Instructions (Signed)
  Valerie Parker , Thank you for taking time to come for your Medicare Wellness Visit. I appreciate your ongoing commitment to your health goals. Please review the following plan we discussed and let me know if I can assist you in the future.   These are the goals we discussed:  Goals      Patient Stated     Stay well until the 1st of the year      Patient Stated     Get credit card debt down        This is a list of the screening recommended for you and due dates:  Health Maintenance  Topic Date Due   COVID-19 Vaccine (1) Never done   Zoster (Shingles) Vaccine (1 of 2) Never done   Pneumonia Vaccine (1 - PCV) Never done   Flu Shot  03/22/2022   Mammogram  05/11/2022   DEXA scan (bone density measurement)  05/11/2022   Colon Cancer Screening  09/08/2025   Tetanus Vaccine  05/12/2031   Hepatitis C Screening: USPSTF Recommendation to screen - Ages 18-79 yo.  Completed   HPV Vaccine  Aged Out

## 2022-05-17 NOTE — Progress Notes (Signed)
I connected with  Valerie Parker on 45/80/99 by a audio enabled telemedicine application and verified that I am speaking with the correct person using two identifiers.  Patient Location: Home  Provider Location: Office/Clinic  I discussed the limitations of evaluation and management by telemedicine. The patient expressed understanding and agreed to proceed.  Subjective:   Valerie Parker is a 66 y.o. female who presents for Medicare Annual (Subsequent) preventive examination.  Review of Systems           Objective:    There were no vitals filed for this visit. There is no height or weight on file to calculate BMI.     04/24/2021    9:58 AM 04/22/2020   10:58 AM 11/28/2018   12:05 PM 08/27/2015   11:01 AM  Advanced Directives  Does Patient Have a Medical Advance Directive? No No No No  Would patient like information on creating a medical advance directive? No - Patient declined No - Patient declined No - Patient declined No - patient declined information    Current Medications (verified) Outpatient Encounter Medications as of 05/17/2022  Medication Sig   amLODipine (NORVASC) 5 MG tablet Take 1 tablet by mouth once daily   atorvastatin (LIPITOR) 20 MG tablet Take 1 tablet (20 mg total) by mouth daily.   Blood Pressure Monitoring (BLOOD PRESSURE CUFF) MISC 1 Act by Does not apply route daily.   clobetasol cream (TEMOVATE) 0.05 % Use bid for 2 weeks then 2-3 x weekly (Patient not taking: Reported on 10/11/2021)   conjugated estrogens (PREMARIN) vaginal cream Use daily for 2 weeks then 2-3 x weekly (Patient not taking: Reported on 10/11/2021)   lisinopril (ZESTRIL) 40 MG tablet Take 1 tablet by mouth once daily   nystatin-triamcinolone ointment (MYCOLOG) Apply 1 application topically 2 (two) times daily. (Patient not taking: Reported on 10/11/2021)   No facility-administered encounter medications on file as of 05/17/2022.    Allergies (verified) Patient has no known allergies.    History: Past Medical History:  Diagnosis Date   ABDOMINAL BLOATING 11/18/2008   Qualifier: Diagnosis of  By: [name] MD, [name]     Basal cell carcinoma    on face   Hyperlipidemia    Hypertension    Lichen sclerosus et atrophicus 05/27/2020   Low back pain    Past Surgical History:  Procedure Laterality Date   CESAREAN SECTION     3 times   COLONOSCOPY  2010   repeat 3 years - polyps   TONSILLECTOMY     Family History  Problem Relation Age of Onset   Cancer Mother 49       alcoholism; throat cancer   Diabetes Mother    Alcohol abuse Mother    COPD Father    Alcohol abuse Father    Heart disease Father    Hypertension Father    Diabetes Sister    Hepatitis C Brother    Hepatitis C Sister    Hepatitis C Sister    Mental illness Daughter    Colon cancer Neg Hx    Social History   Socioeconomic History   Marital status: Widowed    Spouse name: Not on file   Number of children: 3   Years of education: Not on file   Highest education level: Some college, no degree  Occupational History   Occupation: CNA  Tobacco Use   Smoking status: Never   Smokeless tobacco: Never  Vaping Use   Vaping Use: Never used  Substance and Sexual Activity   Alcohol use: No    Alcohol/week: 0.0 standard drinks of alcohol   Drug use: No   Sexual activity: Not Currently    Birth control/protection: Post-menopausal  Other Topics Concern   Not on file  Social History Narrative   Lives alone now-husband passed in 2015- from heart condition    3 children-live close by; 4 granchildren   4 cats and 1 dog   Enjoys: out and about, grandchildren      Diet: eats all food groups   Caffeine: 1 pot of coffee daily   Water: 1-2 cups daily       Wears seat belt   Does not use phone while driving    Smoke detectors at home.   Social Determinants of Health   Financial Resource Strain: Low Risk  (04/24/2021)   Overall Financial Resource Strain (CARDIA)    Difficulty of Paying Living  Expenses: Not hard at all  Food Insecurity: No Food Insecurity (04/24/2021)   Hunger Vital Sign    Worried About Running Out of Food in the Last Year: Never true    Ran Out of Food in the Last Year: Never true  Transportation Needs: No Transportation Needs (04/24/2021)   PRAPARE - Hydrologist (Medical): No    Lack of Transportation (Non-Medical): No  Physical Activity: Insufficiently Active (04/24/2021)   Exercise Vital Sign    Days of Exercise per Week: 2 days    Minutes of Exercise per Session: 30 min  Stress: No Stress Concern Present (04/24/2021)   [place]    Feeling of Stress : Not at all  Social Connections: Moderately Isolated (04/24/2021)   Social Connection and Isolation Panel [NHANES]    Frequency of Communication with Friends and Family: More than three times a week    Frequency of Social Gatherings with Friends and Family: More than three times a week    Attends Religious Services: More than 4 times per year    Active Member of [employer] or Organizations: No    Attends Archivist Meetings: Never    Marital Status: Widowed    Tobacco Counseling Counseling given: Not Answered   Clinical Intake:                 Diabetic?no         Activities of Daily Living     No data to display          Patient Care Team: Renee Rival, FNP as PCP - General (Nurse Practitioner)  Indicate any recent Medical Services you may have received from other than [facility] providers in the past year (date may be approximate).     Assessment:   This is a routine wellness examination for Maley.  Hearing/Vision screen No results found.  Dietary issues and exercise activities discussed:     Goals Addressed   None    Depression Screen    10/11/2021   10:48 AM 05/11/2021    9:13 AM 04/24/2021    9:59 AM 04/24/2021    9:55 AM 12/28/2020   10:41 AM 10/20/2020   10:51 AM  05/27/2020   11:05 AM  PHQ 2/9 Scores  PHQ - 2 Score 0 0 0 0 0 0 0  PHQ- 9 Score       0    Fall Risk    10/11/2021   10:48 AM 05/11/2021    9:13  AM 04/24/2021    9:58 AM 12/28/2020   10:41 AM 10/20/2020   10:51 AM  Fall Risk   Falls in the past year? 0 0 0 0 0  Number falls in past yr: 0 0 0 0 0  Injury with Fall? 0 0 0 0 0  Risk for fall due to : No Fall Risks No Fall Risks No Fall Risks No Fall Risks No Fall Risks  Follow up Falls evaluation completed Falls evaluation completed Falls evaluation completed Falls evaluation completed Falls evaluation completed    [place]:  Any stairs in or around the home? Yes  If so, are there any without handrails? Yes  Home free of loose throw rugs in walkways, pet beds, electrical cords, etc? Yes  Adequate lighting in your home to reduce risk of falls? Yes   ASSISTIVE DEVICES UTILIZED TO PREVENT FALLS:  Life alert? No  Use of a cane, walker or w/c? No  Grab bars in the bathroom? yes Shower chair or bench in shower? No  Elevated toilet seat or a handicapped toilet? No   TIMED UP AND GO:   Cognitive Function:    04/24/2021   10:00 AM  MMSE - Mini Mental State Exam  Not completed: Unable to complete        04/24/2021   10:01 AM 04/22/2020   10:59 AM  6CIT Screen  What Year? 0 points 0 points  What month? 0 points 0 points  What time? 0 points 0 points  Count back from 20 0 points 0 points  Months in reverse 0 points 0 points  Repeat phrase 0 points 0 points  Total Score 0 points 0 points    Immunizations Immunization History  Administered Date(s) Administered   Influenza Whole 05/22/2008   Influenza-Unspecified 05/14/2018   Td 08/23/2007   Tdap 05/11/2021    TDAP status: Up to date  Flu Vaccine status: Due, Education has been provided regarding the importance of this vaccine. Advised may receive this vaccine at local pharmacy or Health Dept. Aware to provide a copy of the vaccination  record if obtained from local pharmacy or Health Dept. Verbalized acceptance and understanding.  Pneumococcal vaccine status: Due, Education has been provided regarding the importance of this vaccine. Advised may receive this vaccine at local pharmacy or Health Dept. Aware to provide a copy of the vaccination record if obtained from local pharmacy or Health Dept. Verbalized acceptance and understanding.  Covid-19 vaccine status: Information provided on how to obtain vaccines.   Qualifies for Shingles Vaccine? Yes   Zostavax completed No   Shingrix Completed?: No.    Education has been provided regarding the importance of this vaccine. Patient has been advised to call insurance company to determine out of pocket expense if they have not yet received this vaccine. Advised may also receive vaccine at local pharmacy or Health Dept. Verbalized acceptance and understanding.  Screening Tests Health Maintenance  Topic Date Due   COVID-19 Vaccine (1) Never done   Zoster Vaccines- Shingrix (1 of 2) Never done   Pneumonia Vaccine 53+ Years old (1 - PCV) Never done   INFLUENZA VACCINE  03/22/2022   MAMMOGRAM  05/11/2022   DEXA SCAN  05/11/2022   COLONOSCOPY (Pts 45-87yr Insurance coverage will need to be confirmed)  09/08/2025   TETANUS/TDAP  05/12/2031   Hepatitis C Screening  Completed   HPV VACCINES  Aged Out    Health Maintenance  Health Maintenance Due  Topic Date Due   COVID-19 Vaccine (1) Never done   Zoster Vaccines- Shingrix (1 of 2) Never done   Pneumonia Vaccine 32+ Years old (1 - PCV) Never done   INFLUENZA VACCINE  03/22/2022   MAMMOGRAM  05/11/2022   DEXA SCAN  05/11/2022    Up to date with colonoscopy next due in 2027, mammogram is ordered patient advised to call for scheduling, DEXA scan ordered today     Lung Cancer Screening: (Low Dose CT Chest recommended if Age 53-80 years, 30 pack-year currently smoking OR have quit w/in 15years.) does not qualify.   Lung Cancer  Screening Referral:   Additional Screening:  Hepatitis C Screening: does not qualify; Completed yes   Vision Screening: Recommended annual ophthalmology exams for early detection of glaucoma and other disorders of the eye. Is the patient up to date with their annual eye exam?  No  Who is the provider or what is the name of the office in which the patient attends annual eye exams? [facility] in [place]  If pt is not established with a provider, would they like to be referred to a provider to establish care? No .   Dental Screening: Recommended annual dental exams for proper oral hygiene  Community Resource Referral / Chronic Care Management:     Plan:     I have personally reviewed and noted the following in the patient's chart:   Medical and social history Use of alcohol, tobacco or illicit drugs  Current medications and supplements including opioid prescriptions. Patient is not currently taking opioid prescriptions. Functional ability and status Nutritional status Physical activity Advanced directives List of other physicians Hospitalizations, surgeries, and ER visits in previous 12 months Vitals Screenings to include cognitive, depression, and falls Referrals and appointments  In addition, I have reviewed and discussed with patient certain preventive protocols, quality metrics, and best practice recommendations. A written personalized care plan for preventive services as well as general preventive health recommendations were provided to patient.     Renee Rival, FNP   05/17/2022   Nurse Notes:

## 2022-05-20 NOTE — Addendum Note (Signed)
Addended by: Eual Fines on: 05/20/2022 01:46 PM   Modules accepted: Orders, Level of Service

## 2022-06-15 DIAGNOSIS — H524 Presbyopia: Secondary | ICD-10-CM | POA: Diagnosis not present

## 2022-06-22 ENCOUNTER — Other Ambulatory Visit: Payer: Self-pay | Admitting: Internal Medicine

## 2022-06-22 DIAGNOSIS — I10 Essential (primary) hypertension: Secondary | ICD-10-CM

## 2022-08-23 ENCOUNTER — Encounter: Payer: Self-pay | Admitting: Family Medicine

## 2022-08-23 ENCOUNTER — Ambulatory Visit (INDEPENDENT_AMBULATORY_CARE_PROVIDER_SITE_OTHER): Payer: Medicare HMO | Admitting: Family Medicine

## 2022-08-23 VITALS — BP 148/86 | HR 69 | Ht 63.0 in | Wt 146.0 lb

## 2022-08-23 DIAGNOSIS — J029 Acute pharyngitis, unspecified: Secondary | ICD-10-CM

## 2022-08-23 DIAGNOSIS — I10 Essential (primary) hypertension: Secondary | ICD-10-CM

## 2022-08-23 LAB — POCT RAPID STREP A (OFFICE): Rapid Strep A Screen: NEGATIVE

## 2022-08-23 MED ORDER — AMLODIPINE BESYLATE 5 MG PO TABS: 5.0000 mg | ORAL_TABLET | Freq: Every day | ORAL | 1 refills | Status: DC

## 2022-08-23 MED ORDER — AMLODIPINE BESYLATE 5 MG PO TABS: 5.0000 mg | ORAL_TABLET | Freq: Every day | ORAL | 0 refills | Status: DC

## 2022-08-23 MED ORDER — BLOOD PRESSURE CUFF MISC: 1.0000 | Freq: Every day | 0 refills | Status: DC

## 2022-08-23 MED ORDER — LISINOPRIL 40 MG PO TABS: 40.0000 mg | ORAL_TABLET | Freq: Every day | ORAL | 0 refills | Status: DC

## 2022-08-23 MED ORDER — AMOXICILLIN 500 MG PO CAPS: 500.0000 mg | ORAL_CAPSULE | Freq: Three times a day (TID) | ORAL | 0 refills | Status: AC

## 2022-08-23 NOTE — Patient Instructions (Addendum)
I appreciate the opportunity to provide care to you today!    Follow up:  2  weeks for BP   Please pick up your medication at the pharmacy and start therapy take medication as prescribed. Increase fluids and allow for plenty of rest. Recommend Tylenol or ibuprofen as needed for pain, fever, or general discomfort. Warm salt water gargles 3-4 times daily to help with throat pain or discomfort. Recommend using a humidifier at bedtime during sleep to help with cough and nasal congestion. Follow-up if your symptoms do not improve    Please start taking fish oil 2000 mg BID for your cholesterol    Please DRINK AT LEAST 64 OUNCE OF WATER DAILY   Please continue to a heart-healthy diet and increase your physical activities. Try to exercise for 42mns at least FIVE times a week.      It was a pleasure to see you and I look forward to continuing to work together on your health and well-being. Please do not hesitate to call the office if you need care or have questions about your care.   Have a wonderful day and week. With Gratitude, GAlvira MondayMSN, FNP-BC

## 2022-08-23 NOTE — Assessment & Plan Note (Signed)
Uncontrolled She takes amlodipine 5 mg and lisinopril 40 mg daily She reports not taking amlodipine 5 mg today, reporting that she needs a refill of her medication She reports taking lisinopril 40 mg on 08/22/2022 Patient is currently asymptomatic We will follow-up on BP in 2 weeks Refill of amlodipine 5 mg sent to her pharmacy encourage patient to start taking BP Readings from Last 3 Encounters:  08/23/22 (!) 148/86  10/11/21 118/77  05/11/21 (!) 144/82

## 2022-08-23 NOTE — Assessment & Plan Note (Signed)
She complains of sore throat that worsens with swallowing She voices hoarseness in her voice with erythematous uvula Symptom onset 08/15/2022 She reports 2 occurrences of low-grade fever, the highest 100.0 She complains of nasal congestion, rhinorrhea and trouble swallowing Negative strep test Physical examination indicative of pharyngitis Patient is outside the window to be tested for flu and COVID Encouraged to take medication as prescribed Increase fluids and allow for plenty of rest. Recommend Tylenol or ibuprofen as needed for pain, fever, or general discomfort. Warm salt water gargles 3-4 times daily to help with throat pain or discomfort. Recommend using a humidifier at bedtime during sleep to help with cough and nasal congestion. Follow-up if your symptoms do not improve

## 2022-08-23 NOTE — Progress Notes (Signed)
Acute Office Visit  Subjective:    Patient ID: Valerie Parker, female    DOB: 02-15-1956, 67 y.o.   MRN: [id number]  Chief Complaint  Patient presents with   Sore Throat    Pt reports sore throat since 08/15/2022, had a low grade fever 99.0 and 100.0 been using ibuprofen and tylenol, started to get better but still having some redness in the back of her throat, hurts to swallow.     Sore Throat  Associated symptoms include congestion and trouble swallowing. Pertinent negatives include no coughing, drooling or ear pain.   Patient is in today for complains of sore throat since 08/15/22.   Past Medical History:  Diagnosis Date   ABDOMINAL BLOATING 11/18/2008   Qualifier: Diagnosis of  By: [name] MD, [name]     Basal cell carcinoma    on face   Hyperlipidemia    Hypertension    Lichen sclerosus et atrophicus 05/27/2020   Low back pain     Past Surgical History:  Procedure Laterality Date   CESAREAN SECTION     3 times   COLONOSCOPY  2010   repeat 3 years - polyps   TONSILLECTOMY      Family History  Problem Relation Age of Onset   Cancer Mother 46       alcoholism; throat cancer   Diabetes Mother    Alcohol abuse Mother    COPD Father    Alcohol abuse Father    Heart disease Father    Hypertension Father    Diabetes Sister    Hepatitis C Brother    Hepatitis C Sister    Hepatitis C Sister    Mental illness Daughter    Colon cancer Neg Hx     Social History   Socioeconomic History   Marital status: Widowed    Spouse name: Not on file   Number of children: 3   Years of education: Not on file   Highest education level: Some college, no degree  Occupational History   Occupation: CNA  Tobacco Use   Smoking status: Never   Smokeless tobacco: Never  Vaping Use   Vaping Use: Never used  Substance and Sexual Activity   Alcohol use: No    Alcohol/week: 0.0 standard drinks of alcohol   Drug use: No   Sexual activity: Not Currently    Birth  control/protection: Post-menopausal  Other Topics Concern   Not on file  Social History Narrative   Lives alone now-husband passed in 2015- from heart condition    3 children-live close by; 4 granchildren   4 cats and 1 dog   Enjoys: out and about, grandchildren      Diet: eats all food groups   Caffeine: 1 pot of coffee daily   Water: 1-2 cups daily       Wears seat belt   Does not use phone while driving    Smoke detectors at home.   Social Determinants of Health   Financial Resource Strain: Low Risk  (05/17/2022)   Overall Financial Resource Strain (CARDIA)    Difficulty of Paying Living Expenses: Not hard at all  Food Insecurity: No Food Insecurity (05/17/2022)   Hunger Vital Sign    Worried About Running Out of Food in the Last Year: Never true    Ran Out of Food in the Last Year: Never true  Transportation Needs: No Transportation Needs (05/17/2022)   PRAPARE - Hydrologist (Medical): No  Lack of Transportation (Non-Medical): No  Physical Activity: Sufficiently Active (05/17/2022)   Exercise Vital Sign    Days of Exercise per Week: 3 days    Minutes of Exercise per Session: 60 min  Stress: No Stress Concern Present (05/17/2022)   [place]    Feeling of Stress : Not at all  Social Connections: Moderately Isolated (05/17/2022)   Social Connection and Isolation Panel [NHANES]    Frequency of Communication with Friends and Family: More than three times a week    Frequency of Social Gatherings with Friends and Family: More than three times a week    Attends Religious Services: More than 4 times per year    Active Member of [employer] or Organizations: No    Attends Archivist Meetings: Never    Marital Status: Widowed  Intimate Partner Violence: Not At Risk (05/17/2022)   Humiliation, Afraid, Rape, and Kick questionnaire    Fear of Current or Ex-Partner: No    Emotionally  Abused: No    Physically Abused: No    Sexually Abused: No    Outpatient Medications Prior to Visit  Medication Sig Dispense Refill   clobetasol cream (TEMOVATE) 0.05 % Use bid for 2 weeks then 2-3 x weekly 45 g 2   conjugated estrogens (PREMARIN) vaginal cream Use daily for 2 weeks then 2-3 x weekly 24 g 12   nystatin-triamcinolone ointment (MYCOLOG) Apply 1 application topically 2 (two) times daily. 30 g 0   amLODipine (NORVASC) 5 MG tablet Take 1 tablet by mouth once daily 90 tablet 0   Blood Pressure Monitoring (BLOOD PRESSURE CUFF) MISC 1 Act by Does not apply route daily. 1 each 0   lisinopril (ZESTRIL) 40 MG tablet Take 1 tablet by mouth once daily 90 tablet 0   atorvastatin (LIPITOR) 20 MG tablet Take 1 tablet (20 mg total) by mouth daily. (Patient not taking: Reported on 08/23/2022) 90 tablet 3   No facility-administered medications prior to visit.    No Known Allergies  Review of Systems  Constitutional:  Negative for chills.  HENT:  Positive for congestion, rhinorrhea, sore throat and trouble swallowing. Negative for drooling, ear pain, sinus pressure and sinus pain.   Respiratory:  Negative for cough.        Objective:    Physical Exam HENT:     Head:     Comments: Pharyngeal edema noted on physical examination with erythematous uvula. Anterior cervical lymphadenopathy noted    Mouth/Throat:     Tonsils: No tonsillar exudate or tonsillar abscesses.     BP (!) 148/86 (BP Location: Left Arm)   Pulse 69   Ht '5\' 3"'$  (1.6 m)   Wt 146 lb 0.6 oz (66.2 kg)   SpO2 96%   BMI 25.87 kg/m  Wt Readings from Last 3 Encounters:  08/23/22 146 lb 0.6 oz (66.2 kg)  10/11/21 147 lb 1.9 oz (66.7 kg)  05/11/21 149 lb (67.6 kg)       Assessment & Plan:  Pharyngitis, unspecified etiology Assessment & Plan: She complains of sore throat that worsens with swallowing She voices hoarseness in her voice with erythematous uvula Symptom onset 08/15/2022 She reports 2 occurrences  of low-grade fever, the highest 100.0 She complains of nasal congestion, rhinorrhea and trouble swallowing Negative strep test Physical examination indicative of pharyngitis Patient is outside the window to be tested for flu and COVID Encouraged to take medication as prescribed Increase fluids and allow for  plenty of rest. Recommend Tylenol or ibuprofen as needed for pain, fever, or general discomfort. Warm salt water gargles 3-4 times daily to help with throat pain or discomfort. Recommend using a humidifier at bedtime during sleep to help with cough and nasal congestion. Follow-up if your symptoms do not improve     Orders: -     Amoxicillin; Take 1 capsule (500 mg total) by mouth 3 (three) times daily for 10 days.  Dispense: 30 capsule; Refill: 0  Essential hypertension Assessment & Plan: Uncontrolled She takes amlodipine 5 mg and lisinopril 40 mg daily She reports not taking amlodipine 5 mg today, reporting that she needs a refill of her medication She reports taking lisinopril 40 mg on 08/22/2022 Patient is currently asymptomatic We will follow-up on BP in 2 weeks Refill of amlodipine 5 mg sent to her pharmacy encourage patient to start taking BP Readings from Last 3 Encounters:  08/23/22 (!) 148/86  10/11/21 118/77  05/11/21 (!) 144/82     Orders: -     Lisinopril; Take 1 tablet (40 mg total) by mouth daily.  Dispense: 90 tablet; Refill: 0 -     Blood Pressure Cuff; 1 Act by Does not apply route daily.  Dispense: 1 each; Refill: 0 -     amLODIPine Besylate; Take 1 tablet (5 mg total) by mouth daily.  Dispense: 90 tablet; Refill: 1  Sore throat -     Coronavirus (COVID-19) with Influenza A and Influenza B -     POCT rapid strep A -     Amoxicillin; Take 1 capsule (500 mg total) by mouth 3 (three) times daily for 10 days.  Dispense: 30 capsule; Refill: 0    Alvira Monday, FNP

## 2022-08-23 NOTE — Progress Notes (Signed)
strep

## 2022-09-05 ENCOUNTER — Other Ambulatory Visit: Payer: Self-pay

## 2022-09-05 DIAGNOSIS — I10 Essential (primary) hypertension: Secondary | ICD-10-CM

## 2022-09-05 MED ORDER — AMLODIPINE BESYLATE 5 MG PO TABS: 5.0000 mg | ORAL_TABLET | Freq: Every day | ORAL | 1 refills | Status: DC

## 2022-09-05 MED ORDER — LISINOPRIL 40 MG PO TABS: 40.0000 mg | ORAL_TABLET | Freq: Every day | ORAL | 0 refills | Status: DC

## 2022-09-06 ENCOUNTER — Other Ambulatory Visit: Payer: Self-pay

## 2022-09-06 ENCOUNTER — Telehealth: Payer: Self-pay | Admitting: Family Medicine

## 2022-09-06 NOTE — Telephone Encounter (Signed)
Spoke to pt will come to appt on 10/13/2022

## 2022-09-06 NOTE — Telephone Encounter (Signed)
Pt wants to know if she needs labs before her CPE in Feb?

## 2022-09-07 ENCOUNTER — Ambulatory Visit: Payer: Medicare HMO | Admitting: Family Medicine

## 2022-10-13 ENCOUNTER — Ambulatory Visit (INDEPENDENT_AMBULATORY_CARE_PROVIDER_SITE_OTHER): Payer: Medicare HMO | Admitting: Family Medicine

## 2022-10-13 ENCOUNTER — Encounter: Payer: Self-pay | Admitting: Family Medicine

## 2022-10-13 VITALS — BP 128/72 | HR 63 | Ht 63.0 in | Wt 148.1 lb

## 2022-10-13 DIAGNOSIS — R1030 Lower abdominal pain, unspecified: Secondary | ICD-10-CM

## 2022-10-13 DIAGNOSIS — E7800 Pure hypercholesterolemia, unspecified: Secondary | ICD-10-CM | POA: Diagnosis not present

## 2022-10-13 DIAGNOSIS — R7301 Impaired fasting glucose: Secondary | ICD-10-CM

## 2022-10-13 DIAGNOSIS — Z0001 Encounter for general adult medical examination with abnormal findings: Secondary | ICD-10-CM

## 2022-10-13 DIAGNOSIS — Z13820 Encounter for screening for osteoporosis: Secondary | ICD-10-CM

## 2022-10-13 DIAGNOSIS — I10 Essential (primary) hypertension: Secondary | ICD-10-CM | POA: Diagnosis not present

## 2022-10-13 DIAGNOSIS — E559 Vitamin D deficiency, unspecified: Secondary | ICD-10-CM

## 2022-10-13 DIAGNOSIS — E0789 Other specified disorders of thyroid: Secondary | ICD-10-CM | POA: Diagnosis not present

## 2022-10-13 DIAGNOSIS — Z23 Encounter for immunization: Secondary | ICD-10-CM | POA: Diagnosis not present

## 2022-10-13 LAB — POCT URINALYSIS DIPSTICK
Bilirubin, UA: NEGATIVE
Blood, UA: NEGATIVE
Glucose, UA: NEGATIVE
Ketones, UA: NEGATIVE
Nitrite, UA: NEGATIVE
Protein, UA: NEGATIVE
Spec Grav, UA: 1.02 (ref 1.010–1.025)
Urobilinogen, UA: 0.2 E.U./dL
pH, UA: 7.5 (ref 5.0–8.0)

## 2022-10-13 MED ORDER — SULFAMETHOXAZOLE-TRIMETHOPRIM 800-160 MG PO TABS: 1.0000 | ORAL_TABLET | Freq: Two times a day (BID) | ORAL | 0 refills | Status: AC

## 2022-10-13 NOTE — Assessment & Plan Note (Signed)
Physical exam as documented Counseling is done on healthy lifestyle involving commitment to 150 minutes of exercise per week,  Discussed heart-healthy diet  Follow up in 1 year for CPE

## 2022-10-13 NOTE — Progress Notes (Signed)
Complete physical exam  Patient: Valerie Parker   DOB: 1955/09/03   67 y.o. Female  MRN: [id number]  Subjective:    Chief Complaint  Patient presents with   Annual Exam    Cpe today. No health concerns today.     Valerie Parker is a 67 y.o. female who presents today for a complete physical exam. She reports consuming a general diet.  Walks at work a lot at work and at home  She generally feels well. She reports sleeping well. She does not have additional problems to discuss today.    Most recent fall risk assessment:    10/13/2022   10:02 AM  Fall Risk   Falls in the past year? 0  Number falls in past yr: 0  Injury with Fall? 0  Risk for fall due to : No Fall Risks  Follow up Falls evaluation completed     Most recent depression screenings:    10/13/2022   10:02 AM 08/23/2022    9:59 AM  PHQ 2/9 Scores  PHQ - 2 Score 0 0  PHQ- 9 Score 0 0    Dental: No current dental problems and No regular dental care   Patient Active Problem List   Diagnosis Date Noted   Lower abdominal pain 10/13/2022   Pharyngitis 08/23/2022   Skin lesion 10/11/2021   Leg laceration, left, initial encounter 05/11/2021   Hammer toes, bilateral 10/20/2020   Chronic pain of left knee 10/20/2020   Vitamin D deficiency 10/20/2020   Vaginal atrophy 05/27/2020   Encounter for general adult medical examination with abnormal findings 05/27/2020   Overweight (BMI 25.0-29.9) 12/19/2019   Impaired fasting glucose 12/19/2019   Overriding toe 12/19/2019   History of basal cell carcinoma of skin 12/19/2019   Osteopenia 04/28/2015   Onychomycosis 11/13/2012   HYPERCHOLESTEROLEMIA 04/15/2009   Essential hypertension 11/18/2008   Past Medical History:  Diagnosis Date   ABDOMINAL BLOATING 11/18/2008   Qualifier: Diagnosis of  By: [name] MD, [name]     Basal cell carcinoma    on face   Hyperlipidemia    Hypertension    Lichen sclerosus et atrophicus 05/27/2020   Low back pain    Past Surgical History:   Procedure Laterality Date   CESAREAN SECTION     3 times   COLONOSCOPY  2010   repeat 3 years - polyps   TONSILLECTOMY     Social History   Tobacco Use   Smoking status: Never   Smokeless tobacco: Never  Vaping Use   Vaping Use: Never used  Substance Use Topics   Alcohol use: No    Alcohol/week: 0.0 standard drinks of alcohol   Drug use: No   Social History   Socioeconomic History   Marital status: Widowed    Spouse name: Not on file   Number of children: 3   Years of education: Not on file   Highest education level: Some college, no degree  Occupational History   Occupation: CNA  Tobacco Use   Smoking status: Never   Smokeless tobacco: Never  Vaping Use   Vaping Use: Never used  Substance and Sexual Activity   Alcohol use: No    Alcohol/week: 0.0 standard drinks of alcohol   Drug use: No   Sexual activity: Not Currently    Birth control/protection: Post-menopausal  Other Topics Concern   Not on file  Social History Narrative   Lives alone now-husband passed in 2015- from heart condition  3 children-live close by; 4 granchildren   4 cats and 1 dog   Enjoys: out and about, grandchildren      Diet: eats all food groups   Caffeine: 1 pot of coffee daily   Water: 1-2 cups daily       Wears seat belt   Does not use phone while driving    Smoke detectors at home.   Social Determinants of Health   Financial Resource Strain: Low Risk  (05/17/2022)   Overall Financial Resource Strain (CARDIA)    Difficulty of Paying Living Expenses: Not hard at all  Food Insecurity: No Food Insecurity (05/17/2022)   Hunger Vital Sign    Worried About Running Out of Food in the Last Year: Never true    Ran Out of Food in the Last Year: Never true  Transportation Needs: No Transportation Needs (05/17/2022)   PRAPARE - Hydrologist (Medical): No    Lack of Transportation (Non-Medical): No  Physical Activity: Sufficiently Active (05/17/2022)    Exercise Vital Sign    Days of Exercise per Week: 3 days    Minutes of Exercise per Session: 60 min  Stress: No Stress Concern Present (05/17/2022)   [place]    Feeling of Stress : Not at all  Social Connections: Moderately Isolated (05/17/2022)   Social Connection and Isolation Panel [NHANES]    Frequency of Communication with Friends and Family: More than three times a week    Frequency of Social Gatherings with Friends and Family: More than three times a week    Attends Religious Services: More than 4 times per year    Active Member of [employer] or Organizations: No    Attends Archivist Meetings: Never    Marital Status: Widowed  Intimate Partner Violence: Not At Risk (05/17/2022)   Humiliation, Afraid, Rape, and Kick questionnaire    Fear of Current or Ex-Partner: No    Emotionally Abused: No    Physically Abused: No    Sexually Abused: No   Family Status  Relation Name Status   Mother  Deceased   Father  Deceased   Sister  Alive   Brother  Alive   Sister  Alive   Sister  Alive   Daughter  Alive   Son  Alive   Son  Alive   Neg Hx  (Not Specified)   Family History  Problem Relation Age of Onset   Cancer Mother 45       alcoholism; throat cancer   Diabetes Mother    Alcohol abuse Mother    COPD Father    Alcohol abuse Father    Heart disease Father    Hypertension Father    Diabetes Sister    Hepatitis C Brother    Hepatitis C Sister    Hepatitis C Sister    Mental illness Daughter    Colon cancer Neg Hx    No Known Allergies    Patient Care Team: Alvira Monday, FNP as PCP - General (Family Medicine)   Outpatient Medications Prior to Visit  Medication Sig   amLODipine (NORVASC) 5 MG tablet Take 1 tablet (5 mg total) by mouth daily.   Blood Pressure Monitoring (BLOOD PRESSURE CUFF) MISC 1 Act by Does not apply route daily.   clobetasol cream (TEMOVATE) 0.05 % Use bid for 2 weeks  then 2-3 x weekly   conjugated estrogens (PREMARIN) vaginal cream Use daily for  2 weeks then 2-3 x weekly   lisinopril (ZESTRIL) 40 MG tablet Take 1 tablet (40 mg total) by mouth daily.   nystatin-triamcinolone ointment (MYCOLOG) Apply 1 application topically 2 (two) times daily.   No facility-administered medications prior to visit.    Review of Systems  Constitutional:  Negative for chills, fever and malaise/fatigue.  HENT:  Negative for congestion and sinus pain.   Eyes:  Negative for pain, discharge and redness.  Respiratory:  Negative for cough, sputum production and shortness of breath.   Cardiovascular:  Negative for chest pain, palpitations, claudication and leg swelling.  Gastrointestinal:  Negative for diarrhea, heartburn and nausea.  Genitourinary:  Negative for flank pain and frequency.  Musculoskeletal:  Negative for back pain and joint pain.  Skin:  Negative for itching.  Neurological:  Negative for dizziness, seizures and headaches.  Endo/Heme/Allergies:  Negative for environmental allergies.  Psychiatric/Behavioral:  Negative for memory loss. The patient does not have insomnia.        Objective:    BP 128/72 (BP Location: Left Arm)   Pulse 63   Ht 5' 3"$  (1.6 m)   Wt 148 lb 1.9 oz (67.2 kg)   SpO2 96%   BMI 26.24 kg/m  BP Readings from Last 3 Encounters:  10/13/22 128/72  08/23/22 (!) 148/86  10/11/21 118/77   Wt Readings from Last 3 Encounters:  10/13/22 148 lb 1.9 oz (67.2 kg)  08/23/22 146 lb 0.6 oz (66.2 kg)  10/11/21 147 lb 1.9 oz (66.7 kg)      Physical Exam HENT:     Head: Normocephalic.     Left Ear: External ear normal.     Mouth/Throat:     Mouth: Mucous membranes are moist.  Eyes:     Extraocular Movements: Extraocular movements intact.     Pupils: Pupils are equal, round, and reactive to light.  Cardiovascular:     Rate and Rhythm: Normal rate and regular rhythm.     Heart sounds: No murmur heard. Pulmonary:     Effort: Pulmonary  effort is normal.     Breath sounds: Normal breath sounds.  Abdominal:     Palpations: Abdomen is soft.     Tenderness: There is abdominal tenderness. There is no right CVA tenderness or left CVA tenderness.  Musculoskeletal:     Right lower leg: No edema.     Left lower leg: No edema.  Skin:    Findings: No lesion or rash.  Neurological:     Mental Status: She is alert and oriented to person, place, and time.     GCS: GCS eye subscore is 4. GCS verbal subscore is 5. GCS motor subscore is 6.     Cranial Nerves: No facial asymmetry.     Sensory: No sensory deficit.     Motor: No weakness.     Coordination: Coordination normal. Finger-Nose-Finger Test normal.     Gait: Gait normal.  Psychiatric:        Judgment: Judgment normal.     Results for orders placed or performed in visit on 10/13/22  POCT urinalysis dipstick  Result Value Ref Range   Color, UA yellow    Clarity, UA clear    Glucose, UA Negative Negative   Bilirubin, UA neg    Ketones, UA neg    Spec Grav, UA 1.020 1.010 - 1.025   Blood, UA neg    pH, UA 7.5 5.0 - 8.0   Protein, UA Negative Negative   Urobilinogen, UA  0.2 0.2 or 1.0 E.U./dL   Nitrite, UA neg    Leukocytes, UA Trace (A) Negative   Appearance clear    Odor     Last CBC Lab Results  Component Value Date   WBC 4.6 06/07/2021   HGB 14.1 06/07/2021   HCT 41.8 06/07/2021   MCV 90 06/07/2021   MCH 30.4 06/07/2021   RDW 12.2 06/07/2021   PLT 227 [id number]   Last metabolic panel Lab Results  Component Value Date   GLUCOSE 101 (H) 10/12/2021   NA 146 (H) 10/12/2021   K 4.9 10/12/2021   CL 108 (H) 10/12/2021   CO2 27 10/12/2021   BUN 11 10/12/2021   CREATININE 0.66 10/12/2021   EGFR 97 10/12/2021   CALCIUM 9.3 10/12/2021   PROT 6.5 10/12/2021   ALBUMIN 4.2 10/12/2021   LABGLOB 2.3 10/12/2021   AGRATIO 1.8 10/12/2021   BILITOT 0.3 10/12/2021   ALKPHOS 80 10/12/2021   AST 16 10/12/2021   ALT 11 10/12/2021   ANIONGAP 5 11/28/2018    Last lipids Lab Results  Component Value Date   CHOL 159 10/12/2021   HDL 51 10/12/2021   LDLCALC 96 10/12/2021   TRIG 59 10/12/2021   CHOLHDL 3.1 10/12/2021   Last hemoglobin A1c Lab Results  Component Value Date   HGBA1C 5.5 06/07/2021   Last thyroid functions Lab Results  Component Value Date   TSH 1.64 04/08/2009   Last vitamin D Lab Results  Component Value Date   VD25OH 36.3 10/20/2020   Last vitamin B12 and Folate No results found for: "VITAMINB12", "FOLATE"      Assessment & Plan:    Routine Health Maintenance and Physical Exam  Immunization History  Administered Date(s) Administered   Influenza Whole 05/22/2008   Influenza,trivalent, recombinat, inj, PF 06/20/2022   Influenza-Unspecified 05/14/2018   PNEUMOCOCCAL CONJUGATE-20 10/13/2022   Td 08/23/2007   Tdap 05/11/2021    Health Maintenance  Topic Date Due   COVID-19 Vaccine (1) Never done   Zoster Vaccines- Shingrix (1 of 2) Never done   MAMMOGRAM  05/11/2022   DEXA SCAN  05/11/2022   Medicare Annual Wellness (AWV)  05/18/2023   COLONOSCOPY (Pts 45-95yr Insurance coverage will need to be confirmed)  09/08/2025   DTaP/Tdap/Td (3 - Td or Tdap) 05/12/2031   Pneumonia Vaccine 67 Years old  Completed   INFLUENZA VACCINE  Completed   Hepatitis C Screening  Completed   HPV VACCINES  Aged Out    Discussed health benefits of physical activity, and encouraged her to engage in regular exercise appropriate for her age and condition.  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  Dispense: 10 tablet; Refill: 0  Essential hypertension -     CBC with Differential/Platelet -  CMP14+EGFR  HYPERCHOLESTEROLEMIA -     Lipid panel  Impaired fasting glucose -     Hemoglobin A1c  Vitamin D deficiency -     VITAMIN D 25 Hydroxy (Vit-D Deficiency, Fractures)  Other specified disorders of thyroid -     TSH + free T4  Immunization due -     Pneumococcal conjugate vaccine 20-valent  Osteoporosis screening -     DG Bone Density    Return in about 3 months (around 01/11/2023).     Alvira Monday, FNP

## 2022-10-13 NOTE — Patient Instructions (Addendum)
  I appreciate the opportunity to provide care to you today!    Follow up:  3 months  Labs: please stop by the lab today to get your blood drawn (CBC, CMP, TSH, Lipid profile, HgA1c, Vit D)  Please schedule mammogram and bone denstity  Please go to your local pharmacy for Shingrix vaccine.   Please continue to a heart-healthy diet and increase your physical activities. Try to exercise for 56mns at least five times a week.   Physical activity helps: Lower your blood glucose, improve your heart health, lower your blood pressure and cholesterol, burn calories to help manage her weight, gave you energy, lower stress, and improve his sleep.  The American diabetes Association (ADA) recommends being active for 2-1/2 hours (150 minutes) or more week.  Exercise for 30 minutes, 5 days a week (150 minutes total)     It was a pleasure to see you and I look forward to continuing to work together on your health and well-being. Please do not hesitate to call the office if you need care or have questions about your care.   Have a wonderful day and week. With Gratitude, GAlvira MondayMSN, FNP-BC

## 2022-10-13 NOTE — Assessment & Plan Note (Addendum)
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

## 2022-10-14 ENCOUNTER — Other Ambulatory Visit: Payer: Self-pay | Admitting: Family Medicine

## 2022-10-14 DIAGNOSIS — E785 Hyperlipidemia, unspecified: Secondary | ICD-10-CM

## 2022-10-14 DIAGNOSIS — E559 Vitamin D deficiency, unspecified: Secondary | ICD-10-CM

## 2022-10-14 LAB — LIPID PANEL
Chol/HDL Ratio: 3.7 ratio (ref 0.0–4.4)
Cholesterol, Total: 220 mg/dL — ABNORMAL HIGH (ref 100–199)
HDL: 60 mg/dL (ref >39)
LDL Chol Calc (NIH): 147 mg/dL — ABNORMAL HIGH (ref 0–99)
Triglycerides: 73 mg/dL (ref 0–149)
VLDL Cholesterol Cal: 13 mg/dL (ref 5–40)

## 2022-10-14 LAB — CBC WITH DIFFERENTIAL/PLATELET
Basophils Absolute: 0 10*3/uL (ref 0.0–0.2)
Basos: 1 %
EOS (ABSOLUTE): 0.1 10*3/uL (ref 0.0–0.4)
Eos: 1 %
Hematocrit: 42.4 % (ref 34.0–46.6)
Hemoglobin: 14.1 g/dL (ref 11.1–15.9)
Immature Grans (Abs): 0 10*3/uL (ref 0.0–0.1)
Immature Granulocytes: 0 %
Lymphocytes Absolute: 1.4 10*3/uL (ref 0.7–3.1)
Lymphs: 27 %
MCH: 29.1 pg (ref 26.6–33.0)
MCHC: 33.3 g/dL (ref 31.5–35.7)
MCV: 87 fL (ref 79–97)
Monocytes Absolute: 0.4 10*3/uL (ref 0.1–0.9)
Monocytes: 8 %
Neutrophils Absolute: 3.3 10*3/uL (ref 1.4–7.0)
Neutrophils: 63 %
Platelets: 227 10*3/uL (ref 150–450)
RBC: 4.85 x10E6/uL (ref 3.77–5.28)
RDW: 12.6 % (ref 11.7–15.4)
WBC: 5.1 10*3/uL (ref 3.4–10.8)

## 2022-10-14 LAB — CMP14+EGFR
ALT: 14 IU/L (ref 0–32)
AST: 15 IU/L (ref 0–40)
Albumin/Globulin Ratio: 1.6 (ref 1.2–2.2)
Albumin: 4.4 g/dL (ref 3.9–4.9)
Alkaline Phosphatase: 85 IU/L (ref 44–121)
BUN/Creatinine Ratio: 22 (ref 12–28)
BUN: 15 mg/dL (ref 8–27)
Bilirubin Total: 0.6 mg/dL (ref 0.0–1.2)
CO2: 24 mmol/L (ref 20–29)
Calcium: 9.4 mg/dL (ref 8.7–10.3)
Chloride: 103 mmol/L (ref 96–106)
Creatinine, Ser: 0.68 mg/dL (ref 0.57–1.00)
Globulin, Total: 2.7 g/dL (ref 1.5–4.5)
Glucose: 97 mg/dL (ref 70–99)
Potassium: 4.9 mmol/L (ref 3.5–5.2)
Sodium: 141 mmol/L (ref 134–144)
Total Protein: 7.1 g/dL (ref 6.0–8.5)
eGFR: 95 mL/min/{1.73_m2} (ref >59)

## 2022-10-14 LAB — HEMOGLOBIN A1C
Est. average glucose Bld gHb Est-mCnc: 120 mg/dL
Hgb A1c MFr Bld: 5.8 % — ABNORMAL HIGH (ref 4.8–5.6)

## 2022-10-14 LAB — TSH+FREE T4
Free T4: 1.14 ng/dL (ref 0.82–1.77)
TSH: 0.551 u[IU]/mL (ref 0.450–4.500)

## 2022-10-14 LAB — VITAMIN D 25 HYDROXY (VIT D DEFICIENCY, FRACTURES): Vit D, 25-Hydroxy: 20.5 ng/mL — ABNORMAL LOW (ref 30.0–100.0)

## 2022-10-14 MED ORDER — VITAMIN D (ERGOCALCIFEROL) 1.25 MG (50000 UNIT) PO CAPS: 50000.0000 [IU] | ORAL_CAPSULE | ORAL | 1 refills | Status: DC

## 2022-10-14 MED ORDER — ROSUVASTATIN CALCIUM 10 MG PO TABS: 10.0000 mg | ORAL_TABLET | Freq: Every day | ORAL | 3 refills | Status: DC

## 2022-10-14 NOTE — Progress Notes (Signed)
A weekly vitamin D supplement prescription has been sent to your pharmacy because your vitamin D is low. Your cholesterol levels are elevated. I want your LDL to be less than 100. I recommend avoiding simple carbohydrates including cakes, sweet desserts, ice cream, soda (diet or regular), sweet tea, candies, chips, cookies, store-bought juices, alcohol in excess of 1-2 drinks a day, lemonade, artificial sweeteners, donuts, coffee creamers, and sugar-free products.  I recommend avoiding greasy, fatty foods with increased physical activity. A prescription for rosuvastatin 10 mg has been sent to your pharmacy to help decrease your cholesterol levels. You are prediabetic, I recommend decreasing your intake of foods high in sugar. Your thyroid, kidneys, and liver function are stable.

## 2022-10-19 ENCOUNTER — Telehealth: Payer: Self-pay | Admitting: Family Medicine

## 2022-10-19 NOTE — Telephone Encounter (Signed)
Pt wants to know if she can please get a different medication then she was prescribed? States she is concerned about taking rosuvastatin (CRESTOR) 10 MG tablet. States when her husband was on this medication he has some issues & was put in a wheelchair. States she is wanting to know if she can try Zetia or something natural?   Also states she is working on lowering her numbers.    States she is wanting to know if she needs fasting labs before her next visit?    Can you please contact patient when available?

## 2022-10-24 ENCOUNTER — Ambulatory Visit (HOSPITAL_COMMUNITY)
Admission: RE | Admit: 2022-10-24 | Discharge: 2022-10-24 | Disposition: A | Discharge status: Home / Self Care | Payer: Medicare HMO | Source: Ambulatory Visit | Attending: Family Medicine | Admitting: Family Medicine

## 2022-10-24 ENCOUNTER — Other Ambulatory Visit: Payer: Self-pay | Admitting: Family Medicine

## 2022-10-24 ENCOUNTER — Ambulatory Visit (HOSPITAL_COMMUNITY)
Admission: RE | Admit: 2022-10-24 | Discharge: 2022-10-24 | Disposition: A | Discharge status: Home / Self Care | Payer: Medicare HMO | Source: Ambulatory Visit | Attending: Nurse Practitioner | Admitting: Nurse Practitioner

## 2022-10-24 ENCOUNTER — Encounter (HOSPITAL_COMMUNITY): Payer: Self-pay

## 2022-10-24 DIAGNOSIS — M85852 Other specified disorders of bone density and structure, left thigh: Secondary | ICD-10-CM | POA: Diagnosis not present

## 2022-10-24 DIAGNOSIS — Z13820 Encounter for screening for osteoporosis: Secondary | ICD-10-CM | POA: Diagnosis not present

## 2022-10-24 DIAGNOSIS — E785 Hyperlipidemia, unspecified: Secondary | ICD-10-CM

## 2022-10-24 DIAGNOSIS — Z780 Asymptomatic menopausal state: Secondary | ICD-10-CM | POA: Diagnosis not present

## 2022-10-24 DIAGNOSIS — M8589 Other specified disorders of bone density and structure, multiple sites: Secondary | ICD-10-CM | POA: Diagnosis not present

## 2022-10-24 DIAGNOSIS — Z1231 Encounter for screening mammogram for malignant neoplasm of breast: Secondary | ICD-10-CM | POA: Diagnosis not present

## 2022-10-24 MED ORDER — EZETIMIBE 10 MG PO TABS: 10.0000 mg | ORAL_TABLET | Freq: Every day | ORAL | 3 refills | Status: DC

## 2022-10-24 NOTE — Telephone Encounter (Signed)
Please inform the patient that a prescription for ezetimibe 10 mg has been sent to her pharmacy to take daily.  I have discontinued rosuvastatin 10 mg daily. I recommend avoiding simple carbohydrates, including cakes, sweet desserts, ice cream, soda (diet or regular), sweet tea, candies, chips, cookies, store-bought juices, alcohol in excess of 1-2 drinks a day, lemonade, artificial sweeteners, donuts, coffee creamers, and sugar-free products.  I recommend avoiding greasy, fatty foods with increased physical activity.   We will reassess her labs in 3 months.

## 2022-10-24 NOTE — Telephone Encounter (Signed)
Pt informed

## 2022-10-24 NOTE — Progress Notes (Signed)
Please inform the patient that her dexa scan results show that she has osteopenia. i recommend a dietary intake of approximately 1200 mg daily calcium and ingest a total of 800 international units of vitamin D daily. If there is inadequate dietary intake of calcium, i recommend taking supplemental elemental calcium (generally 500 to 1000 mg/day.

## 2022-10-26 NOTE — Progress Notes (Signed)
Normal mammogram repeat in one year

## 2022-10-30 NOTE — Progress Notes (Signed)
Please inform the patient that her dexa scan results show that she has osteopenia. i recommend a dietary intake of approximately 1200 mg daily calcium and ingest a total of 800 international units of vitamin D daily. If there is inadequate dietary intake of calcium, i recommend taking supplemental elemental calcium (generally 500 to 1000 mg/day.

## 2022-12-07 DIAGNOSIS — H524 Presbyopia: Secondary | ICD-10-CM | POA: Diagnosis not present

## 2023-01-12 ENCOUNTER — Ambulatory Visit: Payer: Medicare HMO | Admitting: Family Medicine

## 2023-01-19 ENCOUNTER — Encounter: Payer: Self-pay | Admitting: Family Medicine

## 2023-01-19 ENCOUNTER — Ambulatory Visit (INDEPENDENT_AMBULATORY_CARE_PROVIDER_SITE_OTHER): Payer: Medicare HMO | Admitting: Family Medicine

## 2023-01-19 VITALS — BP 128/80 | HR 68 | Ht 63.0 in | Wt 133.1 lb

## 2023-01-19 DIAGNOSIS — E7849 Other hyperlipidemia: Secondary | ICD-10-CM

## 2023-01-19 DIAGNOSIS — R7301 Impaired fasting glucose: Secondary | ICD-10-CM | POA: Diagnosis not present

## 2023-01-19 DIAGNOSIS — I10 Essential (primary) hypertension: Secondary | ICD-10-CM | POA: Diagnosis not present

## 2023-01-19 DIAGNOSIS — E038 Other specified hypothyroidism: Secondary | ICD-10-CM | POA: Diagnosis not present

## 2023-01-19 DIAGNOSIS — E559 Vitamin D deficiency, unspecified: Secondary | ICD-10-CM

## 2023-01-19 NOTE — Assessment & Plan Note (Addendum)
Controlled Asymptomatic in the clinic She takes amlodipine 5 mg daily and lisinopril 40 mg daily Reports compliance with treatment regiment Dash eating plan revealed Encouraged low-sodium diet with increased physical activity BP Readings from Last 3 Encounters:  01/19/23 128/80  10/13/22 128/72  08/23/22 (!) 148/86

## 2023-01-19 NOTE — Progress Notes (Signed)
Established Patient Office Visit  Subjective:  Patient ID: Valerie Parker, female    DOB: 21-Oct-1955  Age: 67 y.o. MRN: [id number]  CC:  Chief Complaint  Patient presents with   Chronic Care Management    3 month f/u, pt reports doing well.     HPI Valerie Parker is a 67 y.o. female with past medical history of essential hypertension and vitamin D deficiency presents for f/u of  chronic medical conditions. For the details of today's visit, please refer to the assessment and plan.     Past Medical History:  Diagnosis Date   ABDOMINAL BLOATING 11/18/2008   Qualifier: Diagnosis of  By: Ermalene Searing MD, [name]     Basal cell carcinoma    on face   Hyperlipidemia    Hypertension    Lichen sclerosus et atrophicus 05/27/2020   Low back pain     Past Surgical History:  Procedure Laterality Date   CESAREAN SECTION     3 times   COLONOSCOPY  2010   repeat 3 years - polyps   TONSILLECTOMY      Family History  Problem Relation Age of Onset   Cancer Mother 34       alcoholism; throat cancer   Diabetes Mother    Alcohol abuse Mother    COPD Father    Alcohol abuse Father    Heart disease Father    Hypertension Father    Diabetes Sister    Hepatitis C Brother    Hepatitis C Sister    Hepatitis C Sister    Mental illness Daughter    Colon cancer Neg Hx     Social History   Socioeconomic History   Marital status: Widowed    Spouse name: Not on file   Number of children: 3   Years of education: Not on file   Highest education level: Some college, no degree  Occupational History   Occupation: CNA  Tobacco Use   Smoking status: Never   Smokeless tobacco: Never  Vaping Use   Vaping Use: Never used  Substance and Sexual Activity   Alcohol use: No    Alcohol/week: 0.0 standard drinks of alcohol   Drug use: No   Sexual activity: Not Currently    Birth control/protection: Post-menopausal  Other Topics Concern   Not on file  Social History Narrative   Lives alone  now-husband passed in 2015- from heart condition    3 children-live close by; 4 granchildren   4 cats and 1 dog   Enjoys: out and about, grandchildren      Diet: eats all food groups   Caffeine: 1 pot of coffee daily   Water: 1-2 cups daily       Wears seat belt   Does not use phone while driving    Smoke detectors at home.   Social Determinants of Health   Financial Resource Strain: Low Risk  (05/17/2022)   Overall Financial Resource Strain (CARDIA)    Difficulty of Paying Living Expenses: Not hard at all  Food Insecurity: No Food Insecurity (05/17/2022)   Hunger Vital Sign    Worried About Running Out of Food in the Last Year: Never true    Ran Out of Food in the Last Year: Never true  Transportation Needs: No Transportation Needs (05/17/2022)   PRAPARE - Administrator, Civil Service (Medical): No    Lack of Transportation (Non-Medical): No  Physical Activity: Sufficiently Active (05/17/2022)   Exercise  Vital Sign    Days of Exercise per Week: 3 days    Minutes of Exercise per Session: 60 min  Stress: No Stress Concern Present (05/17/2022)   [employer] of Occupational Health - Occupational Stress Questionnaire    Feeling of Stress : Not at all  Social Connections: Moderately Isolated (05/17/2022)   Social Connection and Isolation Panel [NHANES]    Frequency of Communication with Friends and Family: More than three times a week    Frequency of Social Gatherings with Friends and Family: More than three times a week    Attends Religious Services: More than 4 times per year    Active Member of [employer] or Organizations: No    Attends Banker Meetings: Never    Marital Status: Widowed  Intimate Partner Violence: Not At Risk (05/17/2022)   Humiliation, Afraid, Rape, and Kick questionnaire    Fear of Current or Ex-Partner: No    Emotionally Abused: No    Physically Abused: No    Sexually Abused: No    Outpatient Medications Prior to Visit  Medication  Sig Dispense Refill   amLODipine (NORVASC) 5 MG tablet Take 1 tablet (5 mg total) by mouth daily. 90 tablet 1   Blood Pressure Monitoring (BLOOD PRESSURE CUFF) MISC 1 Act by Does not apply route daily. 1 each 0   lisinopril (ZESTRIL) 40 MG tablet Take 1 tablet (40 mg total) by mouth daily. 90 tablet 0   ezetimibe (ZETIA) 10 MG tablet Take 1 tablet (10 mg total) by mouth daily. 90 tablet 3   clobetasol cream (TEMOVATE) 0.05 % Use bid for 2 weeks then 2-3 x weekly (Patient not taking: Reported on 01/19/2023) 45 g 2   conjugated estrogens (PREMARIN) vaginal cream Use daily for 2 weeks then 2-3 x weekly (Patient not taking: Reported on 01/19/2023) 24 g 12   nystatin-triamcinolone ointment (MYCOLOG) Apply 1 application topically 2 (two) times daily. (Patient not taking: Reported on 01/19/2023) 30 g 0   Vitamin D, Ergocalciferol, (DRISDOL) 1.25 MG (50000 UNIT) CAPS capsule Take 1 capsule (50,000 Units total) by mouth every 7 (seven) days. (Patient not taking: Reported on 01/19/2023) 10 capsule 1   No facility-administered medications prior to visit.    No Known Allergies  ROS Review of Systems  Constitutional:  Negative for chills and fever.  Eyes:  Negative for visual disturbance.  Respiratory:  Negative for chest tightness and shortness of breath.   Neurological:  Negative for dizziness and headaches.      Objective:    Physical Exam HENT:     Head: Normocephalic.     Mouth/Throat:     Mouth: Mucous membranes are moist.  Cardiovascular:     Rate and Rhythm: Normal rate.     Heart sounds: Normal heart sounds.  Pulmonary:     Effort: Pulmonary effort is normal.     Breath sounds: Normal breath sounds.  Neurological:     Mental Status: She is alert.     BP 128/80 (BP Location: Left Arm)   Pulse 68   Ht 5\' 3"  (1.6 m)   Wt 133 lb 1.3 oz (60.4 kg)   SpO2 94%   BMI 23.57 kg/m  Wt Readings from Last 3 Encounters:  01/19/23 133 lb 1.3 oz (60.4 kg)  10/13/22 148 lb 1.9 oz (67.2 kg)   08/23/22 146 lb 0.6 oz (66.2 kg)    Lab Results  Component Value Date   TSH 0.551 10/13/2022   Lab Results  Component Value Date   WBC 5.1 10/13/2022   HGB 14.1 10/13/2022   HCT 42.4 10/13/2022   MCV 87 10/13/2022   PLT 227 10/13/2022   Lab Results  Component Value Date   NA 141 10/13/2022   K 4.9 10/13/2022   CO2 24 10/13/2022   GLUCOSE 97 10/13/2022   BUN 15 10/13/2022   CREATININE 0.68 10/13/2022   BILITOT 0.6 10/13/2022   ALKPHOS 85 10/13/2022   AST 15 10/13/2022   ALT 14 10/13/2022   PROT 7.1 10/13/2022   ALBUMIN 4.4 10/13/2022   CALCIUM 9.4 10/13/2022   ANIONGAP 5 11/28/2018   EGFR 95 10/13/2022   Lab Results  Component Value Date   CHOL 220 (H) 10/13/2022   Lab Results  Component Value Date   HDL 60 10/13/2022   Lab Results  Component Value Date   LDLCALC 147 (H) 10/13/2022   Lab Results  Component Value Date   TRIG 73 10/13/2022   Lab Results  Component Value Date   CHOLHDL 3.7 10/13/2022   Lab Results  Component Value Date   HGBA1C 5.8 (H) 10/13/2022      Assessment & Plan:  Essential hypertension Assessment & Plan: Controlled Asymptomatic in the clinic She takes amlodipine 5 mg daily and lisinopril 40 mg daily Reports compliance with treatment regiment Dash eating plan revealed Encouraged low-sodium diet with increased physical activity BP Readings from Last 3 Encounters:  01/19/23 128/80  10/13/22 128/72  08/23/22 (!) 148/86      Vitamin D deficiency Assessment & Plan: Reports completing prescription for weekly vitamin D supplement Will assess her vitamin D level today and reinstate therapy if needed Last vitamin D Lab Results  Component Value Date   VD25OH 20.5 (L) 10/13/2022     Orders: -     VITAMIN D 25 Hydroxy (Vit-D Deficiency, Fractures)  IFG (impaired fasting glucose) -     Hemoglobin A1c  Other specified hypothyroidism -     TSH + free T4  Other hyperlipidemia -     Lipid panel -     CMP14+EGFR -      CBC with Differential/Platelet   Note: This chart has been completed using Engineer, civil (consulting) software, and while attempts have been made to ensure accuracy, certain words and phrases may not be transcribed as intended.    Follow-up: Return in about 3 months (around 04/21/2023).   Gilmore Laroche, FNP

## 2023-01-19 NOTE — Assessment & Plan Note (Signed)
Reports completing prescription for weekly vitamin D supplement Will assess her vitamin D level today and reinstate therapy if needed Last vitamin D Lab Results  Component Value Date   VD25OH 20.5 (L) 10/13/2022

## 2023-01-19 NOTE — Patient Instructions (Addendum)
I appreciate the opportunity to provide care to you today!    Follow up:  3 months  Labs: please stop by the lab today to get your blood drawn (CBC, CMP, TSH, Lipid profile, HgA1c, Vit D)     Please continue to a heart-healthy diet and increase your physical activities. Try to exercise for 30mins at least five days a week.      It was a pleasure to see you and I look forward to continuing to work together on your health and well-being. Please do not hesitate to call the office if you need care or have questions about your care.   Have a wonderful day and week. With Gratitude, Olyvia Gopal MSN, FNP-BC  

## 2023-01-20 ENCOUNTER — Emergency Department (HOSPITAL_COMMUNITY)
Admission: EM | Admit: 2023-01-20 | Discharge: 2023-01-21 | Disposition: A | Discharge status: Home / Self Care | Payer: Medicare HMO | Attending: Emergency Medicine | Admitting: Emergency Medicine

## 2023-01-20 ENCOUNTER — Other Ambulatory Visit: Payer: Self-pay | Admitting: Family Medicine

## 2023-01-20 ENCOUNTER — Encounter (HOSPITAL_COMMUNITY): Payer: Self-pay

## 2023-01-20 ENCOUNTER — Telehealth: Payer: Self-pay | Admitting: Family Medicine

## 2023-01-20 ENCOUNTER — Other Ambulatory Visit: Payer: Self-pay

## 2023-01-20 DIAGNOSIS — I10 Essential (primary) hypertension: Secondary | ICD-10-CM | POA: Insufficient documentation

## 2023-01-20 DIAGNOSIS — Z79899 Other long term (current) drug therapy: Secondary | ICD-10-CM | POA: Insufficient documentation

## 2023-01-20 DIAGNOSIS — R799 Abnormal finding of blood chemistry, unspecified: Secondary | ICD-10-CM | POA: Diagnosis not present

## 2023-01-20 DIAGNOSIS — E7800 Pure hypercholesterolemia, unspecified: Secondary | ICD-10-CM

## 2023-01-20 DIAGNOSIS — E785 Hyperlipidemia, unspecified: Secondary | ICD-10-CM

## 2023-01-20 DIAGNOSIS — R899 Unspecified abnormal finding in specimens from other organs, systems and tissues: Secondary | ICD-10-CM

## 2023-01-20 DIAGNOSIS — R7989 Other specified abnormal findings of blood chemistry: Secondary | ICD-10-CM | POA: Insufficient documentation

## 2023-01-20 DIAGNOSIS — E875 Hyperkalemia: Secondary | ICD-10-CM

## 2023-01-20 LAB — CBC WITH DIFFERENTIAL/PLATELET
Basophils Absolute: 0 10*3/uL (ref 0.0–0.2)
Basos: 1 %
EOS (ABSOLUTE): 0.1 10*3/uL (ref 0.0–0.4)
Eos: 1 %
Hematocrit: 40.6 % (ref 34.0–46.6)
Hemoglobin: 13.5 g/dL (ref 11.1–15.9)
Immature Grans (Abs): 0 10*3/uL (ref 0.0–0.1)
Immature Granulocytes: 0 %
Lymphocytes Absolute: 1.4 10*3/uL (ref 0.7–3.1)
Lymphs: 30 %
MCH: 30.5 pg (ref 26.6–33.0)
MCHC: 33.3 g/dL (ref 31.5–35.7)
MCV: 92 fL (ref 79–97)
Monocytes Absolute: 0.4 10*3/uL (ref 0.1–0.9)
Monocytes: 9 %
Neutrophils Absolute: 2.9 10*3/uL (ref 1.4–7.0)
Neutrophils: 59 %
Platelets: 209 10*3/uL (ref 150–450)
RBC: 4.42 x10E6/uL (ref 3.77–5.28)
RDW: 12 % (ref 11.7–15.4)
WBC: 4.9 10*3/uL (ref 3.4–10.8)

## 2023-01-20 LAB — TSH+FREE T4
Free T4: 1.12 ng/dL (ref 0.82–1.77)
TSH: 1.12 u[IU]/mL (ref 0.450–4.500)

## 2023-01-20 LAB — CMP14+EGFR
ALT: 12 IU/L (ref 0–32)
AST: 12 IU/L (ref 0–40)
Albumin/Globulin Ratio: 1.6 (ref 1.2–2.2)
Albumin: 4 g/dL (ref 3.9–4.9)
Alkaline Phosphatase: 68 IU/L (ref 44–121)
BUN/Creatinine Ratio: 33 — ABNORMAL HIGH (ref 12–28)
BUN: 21 mg/dL (ref 8–27)
Bilirubin Total: 0.3 mg/dL (ref 0.0–1.2)
CO2: 24 mmol/L (ref 20–29)
Calcium: 9.7 mg/dL (ref 8.7–10.3)
Chloride: 108 mmol/L — ABNORMAL HIGH (ref 96–106)
Creatinine, Ser: 0.63 mg/dL (ref 0.57–1.00)
Globulin, Total: 2.5 g/dL (ref 1.5–4.5)
Glucose: 98 mg/dL (ref 70–99)
Potassium: 6 mmol/L — ABNORMAL HIGH (ref 3.5–5.2)
Sodium: 145 mmol/L — ABNORMAL HIGH (ref 134–144)
Total Protein: 6.5 g/dL (ref 6.0–8.5)
eGFR: 97 mL/min/{1.73_m2} (ref >59)

## 2023-01-20 LAB — LIPID PANEL
Chol/HDL Ratio: 3.8 ratio (ref 0.0–4.4)
Cholesterol, Total: 184 mg/dL (ref 100–199)
HDL: 48 mg/dL (ref >39)
LDL Chol Calc (NIH): 127 mg/dL — ABNORMAL HIGH (ref 0–99)
Triglycerides: 46 mg/dL (ref 0–149)
VLDL Cholesterol Cal: 9 mg/dL (ref 5–40)

## 2023-01-20 LAB — HEMOGLOBIN A1C
Est. average glucose Bld gHb Est-mCnc: 111 mg/dL
Hgb A1c MFr Bld: 5.5 % (ref 4.8–5.6)

## 2023-01-20 LAB — VITAMIN D 25 HYDROXY (VIT D DEFICIENCY, FRACTURES): Vit D, 25-Hydroxy: 54.6 ng/mL (ref 30.0–100.0)

## 2023-01-20 MED ORDER — ROSUVASTATIN CALCIUM 10 MG PO TABS: 10.0000 mg | ORAL_TABLET | Freq: Every day | ORAL | 3 refills | Status: DC

## 2023-01-20 MED ORDER — LOKELMA 10 G PO PACK: 10.0000 g | PACK | Freq: Three times a day (TID) | ORAL | 0 refills | Status: DC

## 2023-01-20 MED ORDER — EZETIMIBE 10 MG PO TABS: 10.0000 mg | ORAL_TABLET | Freq: Every day | ORAL | 3 refills | Status: DC

## 2023-01-20 NOTE — ED Triage Notes (Signed)
Pt arrived via POV, from home reporting she is here for blood draw recheck due to her PCP/NP calling her late this evening to seek ER evaluation for a K+ level of 6.0 following a blood test she reports they did earlier this morning. Pt presents in NAD. Pt denies any CP, N/V.

## 2023-01-20 NOTE — Telephone Encounter (Signed)
I contacted the patient to relay her potassium level of 6.0 and strongly advised her to head to the emergency room for evaluation.The patient denies palpitations, chest pain, nausea, vomiting, abdominal pain, or muscle weakness.

## 2023-01-20 NOTE — Progress Notes (Signed)
The patient communicates her decision not to initiate statin therapy for her cholesterol medications, citing her husband's death related to such medication. In response, Crestor will be discontinued, and a prescription for ezetimibe 10 mg will be sent to the pharmacy

## 2023-01-20 NOTE — Progress Notes (Signed)
The 10-year ASCVD risk score (Arnett DK, et al., 2019) is: 10.3%   Values used to calculate the score:     Age: 67 years     Sex: Female     Is Non-Hispanic African American: No     Diabetic: No     Tobacco smoker: No     Systolic Blood Pressure: 128 mmHg     Is BP treated: Yes     HDL Cholesterol: 48 mg/dL     Total Cholesterol: 184 mg/dL

## 2023-01-21 LAB — BASIC METABOLIC PANEL
Anion gap: 7 (ref 5–15)
BUN: 28 mg/dL — ABNORMAL HIGH (ref 8–23)
CO2: 25 mmol/L (ref 22–32)
Calcium: 8.6 mg/dL — ABNORMAL LOW (ref 8.9–10.3)
Chloride: 105 mmol/L (ref 98–111)
Creatinine, Ser: 1.21 mg/dL — ABNORMAL HIGH (ref 0.44–1.00)
GFR, Estimated: 49 mL/min — ABNORMAL LOW (ref >60)
Glucose, Bld: 104 mg/dL — ABNORMAL HIGH (ref 70–99)
Potassium: 3.5 mmol/L (ref 3.5–5.1)
Sodium: 137 mmol/L (ref 135–145)

## 2023-01-21 LAB — CBC WITH DIFFERENTIAL/PLATELET
Abs Immature Granulocytes: 0.02 10*3/uL (ref 0.00–0.07)
Basophils Absolute: 0 10*3/uL (ref 0.0–0.1)
Basophils Relative: 1 %
Eosinophils Absolute: 0.1 10*3/uL (ref 0.0–0.5)
Eosinophils Relative: 1 %
HCT: 40.6 % (ref 36.0–46.0)
Hemoglobin: 13.2 g/dL (ref 12.0–15.0)
Immature Granulocytes: 0 %
Lymphocytes Relative: 25 %
Lymphs Abs: 2.2 10*3/uL (ref 0.7–4.0)
MCH: 30.7 pg (ref 26.0–34.0)
MCHC: 32.5 g/dL (ref 30.0–36.0)
MCV: 94.4 fL (ref 80.0–100.0)
Monocytes Absolute: 0.8 10*3/uL (ref 0.1–1.0)
Monocytes Relative: 9 %
Neutro Abs: 5.5 10*3/uL (ref 1.7–7.7)
Neutrophils Relative %: 64 %
Platelets: 217 10*3/uL (ref 150–400)
RBC: 4.3 MIL/uL (ref 3.87–5.11)
RDW: 12.1 % (ref 11.5–15.5)
WBC: 8.5 10*3/uL (ref 4.0–10.5)
nRBC: 0 % (ref 0.0–0.2)

## 2023-01-21 NOTE — ED Provider Notes (Signed)
[place] EMERGENCY DEPARTMENT AT [facility] Provider Note   CSN: [id number] Arrival date & time: 01/20/23  2339     History  Chief Complaint  Patient presents with   Labs Only    Valerie Parker is a 67 y.o. female.  Patient is a 67 year old female with history of hypertension, hyperlipidemia.  Patient presenting today for evaluation of an abnormal lab.  She was seen at her doctor's office earlier today and had blood drawn.  She was called this evening and told to come straight to the ER because her potassium was 6.0.  Patient has no complaints and feels fine.  The history is provided by the patient.       Home Medications Prior to Admission medications   Medication Sig Start Date End Date Taking? Authorizing Provider  amLODipine (NORVASC) 5 MG tablet Take 1 tablet (5 mg total) by mouth daily. 09/05/22   Gilmore Laroche, FNP  Blood Pressure Monitoring (BLOOD PRESSURE CUFF) MISC 1 Act by Does not apply route daily. 08/23/22   Gilmore Laroche, FNP  clobetasol cream (TEMOVATE) 0.05 % Use bid for 2 weeks then 2-3 x weekly Patient not taking: Reported on 01/19/2023 05/27/20   Adline Potter, NP  conjugated estrogens (PREMARIN) vaginal cream Use daily for 2 weeks then 2-3 x weekly Patient not taking: Reported on 01/19/2023 05/27/20   Adline Potter, NP  ezetimibe (ZETIA) 10 MG tablet Take 1 tablet (10 mg total) by mouth daily. 01/20/23   Gilmore Laroche, FNP  lisinopril (ZESTRIL) 40 MG tablet Take 1 tablet (40 mg total) by mouth daily. 09/05/22   Gilmore Laroche, FNP  nystatin-triamcinolone ointment (MYCOLOG) Apply 1 application topically 2 (two) times daily. Patient not taking: Reported on 01/19/2023 06/30/21   Adline Potter, NP  Vitamin D, Ergocalciferol, (DRISDOL) 1.25 MG (50000 UNIT) CAPS capsule Take 1 capsule (50,000 Units total) by mouth every 7 (seven) days. Patient not taking: Reported on 01/19/2023 10/14/22   Gilmore Laroche, FNP      Allergies    Patient  has no known allergies.    Review of Systems   Review of Systems  All other systems reviewed and are negative.   Physical Exam Updated Vital Signs BP 129/72   Pulse 63   Temp 97.8 F (36.6 C) (Oral)   Resp 16   Ht 5\' 3"  (1.6 m)   Wt 60.3 kg   SpO2 99%   BMI 23.55 kg/m  Physical Exam Vitals and nursing note reviewed.  Pulmonary:     Effort: Pulmonary effort is normal.  Neurological:     Mental Status: She is alert and oriented to person, place, and time.     ED Results / Procedures / Treatments   Labs (all labs ordered are listed, but only abnormal results are displayed) Labs Reviewed  BASIC METABOLIC PANEL - Abnormal; Notable for the following components:      Result Value   Glucose, Bld 104 (*)    BUN 28 (*)    Creatinine, Ser 1.21 (*)    Calcium 8.6 (*)    GFR, Estimated 49 (*)    All other components within normal limits  CBC WITH DIFFERENTIAL/PLATELET    EKG None  Radiology No results found.  Procedures Procedures    Medications Ordered in ED Medications - No data to display  ED Course/ Medical Decision Making/ A&P  Patient sent by her primary doctor for a potassium of 6.0.  This was repeated here and is  3.5.  The sample from earlier today was clearly hemolyzed and no need for any further testing or workup.  Patient to be discharged with as needed return.  Final Clinical Impression(s) / ED Diagnoses Final diagnoses:  None    Rx / DC Orders ED Discharge Orders     None         Geoffery Lyons, MD 01/21/23 [phone]

## 2023-01-25 ENCOUNTER — Telehealth: Payer: Self-pay | Admitting: *Deleted

## 2023-04-21 ENCOUNTER — Ambulatory Visit (INDEPENDENT_AMBULATORY_CARE_PROVIDER_SITE_OTHER): Payer: Medicare HMO | Admitting: Family Medicine

## 2023-04-21 ENCOUNTER — Encounter: Payer: Self-pay | Admitting: Family Medicine

## 2023-04-21 VITALS — BP 129/82 | HR 64 | Ht 63.0 in | Wt 127.1 lb

## 2023-04-21 DIAGNOSIS — E559 Vitamin D deficiency, unspecified: Secondary | ICD-10-CM | POA: Diagnosis not present

## 2023-04-21 DIAGNOSIS — E038 Other specified hypothyroidism: Secondary | ICD-10-CM | POA: Diagnosis not present

## 2023-04-21 DIAGNOSIS — R7301 Impaired fasting glucose: Secondary | ICD-10-CM

## 2023-04-21 DIAGNOSIS — E7800 Pure hypercholesterolemia, unspecified: Secondary | ICD-10-CM

## 2023-04-21 DIAGNOSIS — E1169 Type 2 diabetes mellitus with other specified complication: Secondary | ICD-10-CM

## 2023-04-21 DIAGNOSIS — E785 Hyperlipidemia, unspecified: Secondary | ICD-10-CM

## 2023-04-21 DIAGNOSIS — I10 Essential (primary) hypertension: Secondary | ICD-10-CM | POA: Diagnosis not present

## 2023-04-21 NOTE — Patient Instructions (Addendum)
I appreciate the opportunity to provide care to you today!    Follow up:  4 months  Labs: please stop by the lab today to get your blood drawn (CBC, CMP, TSH, Lipid profile, HgA1c, Vit D)   -Incorporate Nutrient-Dense Foods: Increase your intake of fruits, vegetables, and whole grains to enhance your diet with essential vitamins, minerals, and fiber. -Choose Lean Proteins: Opt for lean protein sources such as chicken, fish, beans, and legumes to support muscle maintenance and overall health. -Select Low-Fat Dairy Products: Include low-fat dairy options to obtain necessary calcium and protein while minimizing excess fat intake. -Reduce Saturated and Trans Fats: Limit your consumption of saturated fats, trans fatty acids, and cholesterol to promote heart health and support weight management. -Engage in Regular Physical Activity: Aim to be active for at least 30 minutes on most days of the week. Activities such as brisk walking can help you maintain a healthy weight and improve overall well-being.   Attached with your AVS, you will find valuable resources for self-education. I highly recommend dedicating some time to thoroughly examine them.   Please continue to a heart-healthy diet and increase your physical activities. Try to exercise for at least five days a week.    It was a pleasure to see you and I look forward to continuing to work together on your health and well-being. Please do not hesitate to call the office if you need care or have questions about your care.  In case of emergency, please visit the Emergency Department for urgent care, or contact our clinic at [phone] to schedule an appointment. We're here to help you!   Have a wonderful day and week. With Gratitude, Gilmore Laroche MSN, FNP-BC

## 2023-04-21 NOTE — Assessment & Plan Note (Signed)
Controlled The patient is currently asymptomatic in the clinic. She is on a treatment regimen of amlodipine 5 mg daily and lisinopril 40 mg daily. She reports adherence to this regimen and has been compliant with her prescribed treatment. The DASH eating plan was reviewed, and she was encouraged to maintain a low-sodium diet and to increase her physical activity to support ongoing blood pressure management. BP Readings from Last 3 Encounters:  04/21/23 129/82  01/21/23 129/72  01/19/23 128/80

## 2023-04-21 NOTE — Assessment & Plan Note (Signed)
The patient reports not taking ezetimibe 10 mg daily but has been making lifestyle changes. I recommend reducing the intake of cholesterol, trans fats, and saturated fats, while increasing the consumption of fruits, vegetables, whole grains, and omega-3 fatty acids. These adjustments will support better management of cholesterol levels and overall cardiovascular health.  Lab Results  Component Value Date   CHOL 184 01/19/2023   HDL 48 01/19/2023   LDLCALC 127 (H) 01/19/2023   TRIG 46 01/19/2023   CHOLHDL 3.8 01/19/2023

## 2023-04-21 NOTE — Progress Notes (Signed)
Established Patient Office Visit  Subjective:  Patient ID: Valerie Parker, female    DOB: 1955/12/06  Age: 67 y.o. MRN: [id number]  CC:  Chief Complaint  Patient presents with   Care Management    3 MONTH F/U, pt reports not taking ezetimibe has been working on cholesterol levels by eating better.     HPI [name] is a 67 y.o. female with past medical history of hypertension, Vit d def and hyperlipidemia presents for f/u of chronic medical conditions. For the details of today's visit, please refer to the assessment and plan.    Past Medical History:  Diagnosis Date   ABDOMINAL BLOATING 11/18/2008   Qualifier: Diagnosis of  By: Ermalene Searing MD, [name]     Basal cell carcinoma    on face   Hyperlipidemia    Hypertension    Lichen sclerosus et atrophicus 05/27/2020   Low back pain     Past Surgical History:  Procedure Laterality Date   CESAREAN SECTION     3 times   COLONOSCOPY  2010   repeat 3 years - polyps   TONSILLECTOMY      Family History  Problem Relation Age of Onset   Cancer Mother 35       alcoholism; throat cancer   Diabetes Mother    Alcohol abuse Mother    COPD Father    Alcohol abuse Father    Heart disease Father    Hypertension Father    Diabetes Sister    Hepatitis C Brother    Hepatitis C Sister    Hepatitis C Sister    Mental illness Daughter    Colon cancer Neg Hx     Social History   Socioeconomic History   Marital status: Widowed    Spouse name: Not on file   Number of children: 3   Years of education: Not on file   Highest education level: Some college, no degree  Occupational History   Occupation: CNA  Tobacco Use   Smoking status: Never   Smokeless tobacco: Never  Vaping Use   Vaping status: Never Used  Substance and Sexual Activity   Alcohol use: No    Alcohol/week: 0.0 standard drinks of alcohol   Drug use: No   Sexual activity: Not Currently    Birth control/protection: Post-menopausal  Other Topics Concern   Not on  file  Social History Narrative   Lives alone now-husband passed in 2015- from heart condition    3 children-live close by; 4 granchildren   4 cats and 1 dog   Enjoys: out and about, grandchildren      Diet: eats all food groups   Caffeine: 1 pot of coffee daily   Water: 1-2 cups daily       Wears seat belt   Does not use phone while driving    Smoke detectors at home.   Social Determinants of Health   Financial Resource Strain: Low Risk  (05/17/2022)   Overall Financial Resource Strain (CARDIA)    Difficulty of Paying Living Expenses: Not hard at all  Food Insecurity: No Food Insecurity (05/17/2022)   Hunger Vital Sign    Worried About Running Out of Food in the Last Year: Never true    Ran Out of Food in the Last Year: Never true  Transportation Needs: No Transportation Needs (05/17/2022)   PRAPARE - Administrator, Civil Service (Medical): No    Lack of Transportation (Non-Medical): No  Physical  Activity: Sufficiently Active (05/17/2022)   Exercise Vital Sign    Days of Exercise per Week: 3 days    Minutes of Exercise per Session: 60 min  Stress: No Stress Concern Present (05/17/2022)   [employer] of Occupational Health - Occupational Stress Questionnaire    Feeling of Stress : Not at all  Social Connections: Moderately Isolated (05/17/2022)   Social Connection and Isolation Panel [NHANES]    Frequency of Communication with Friends and Family: More than three times a week    Frequency of Social Gatherings with Friends and Family: More than three times a week    Attends Religious Services: More than 4 times per year    Active Member of [employer] or Organizations: No    Attends Banker Meetings: Never    Marital Status: Widowed  Intimate Partner Violence: Not At Risk (05/17/2022)   Humiliation, Afraid, Rape, and Kick questionnaire    Fear of Current or Ex-Partner: No    Emotionally Abused: No    Physically Abused: No    Sexually Abused: No     Outpatient Medications Prior to Visit  Medication Sig Dispense Refill   amLODipine (NORVASC) 5 MG tablet Take 1 tablet (5 mg total) by mouth daily. 90 tablet 1   Blood Pressure Monitoring (BLOOD PRESSURE CUFF) MISC 1 Act by Does not apply route daily. 1 each 0   clobetasol cream (TEMOVATE) 0.05 % Use bid for 2 weeks then 2-3 x weekly 45 g 2   conjugated estrogens (PREMARIN) vaginal cream Use daily for 2 weeks then 2-3 x weekly 24 g 12   lisinopril (ZESTRIL) 40 MG tablet Take 1 tablet (40 mg total) by mouth daily. 90 tablet 0   nystatin-triamcinolone ointment (MYCOLOG) Apply 1 application topically 2 (two) times daily. 30 g 0   Vitamin D, Ergocalciferol, (DRISDOL) 1.25 MG (50000 UNIT) CAPS capsule Take 1 capsule (50,000 Units total) by mouth every 7 (seven) days. 10 capsule 1   ezetimibe (ZETIA) 10 MG tablet Take 1 tablet (10 mg total) by mouth daily. 90 tablet 3   No facility-administered medications prior to visit.    No Known Allergies  ROS Review of Systems  Constitutional:  Negative for chills and fever.  Eyes:  Negative for visual disturbance.  Respiratory:  Negative for chest tightness and shortness of breath.   Neurological:  Negative for dizziness and headaches.      Objective:    Physical Exam HENT:     Head: Normocephalic.     Mouth/Throat:     Mouth: Mucous membranes are moist.  Cardiovascular:     Rate and Rhythm: Normal rate.     Heart sounds: Normal heart sounds.  Pulmonary:     Effort: Pulmonary effort is normal.     Breath sounds: Normal breath sounds.  Neurological:     Mental Status: She is alert.     BP 129/82   Pulse 64   Ht 5\' 3"  (1.6 m)   Wt 127 lb 1.9 oz (57.7 kg)   SpO2 95%   BMI 22.52 kg/m  Wt Readings from Last 3 Encounters:  04/21/23 127 lb 1.9 oz (57.7 kg)  01/20/23 132 lb 15 oz (60.3 kg)  01/19/23 133 lb 1.3 oz (60.4 kg)    Lab Results  Component Value Date   TSH 1.120 01/19/2023   Lab Results  Component Value Date   WBC  8.5 01/20/2023   HGB 13.2 01/20/2023   HCT 40.6 01/20/2023   MCV  94.4 01/20/2023   PLT 217 01/20/2023   Lab Results  Component Value Date   NA 137 01/20/2023   K 3.5 01/20/2023   CO2 25 01/20/2023   GLUCOSE 104 (H) 01/20/2023   BUN 28 (H) 01/20/2023   CREATININE 1.21 (H) 01/20/2023   BILITOT 0.3 01/19/2023   ALKPHOS 68 01/19/2023   AST 12 01/19/2023   ALT 12 01/19/2023   PROT 6.5 01/19/2023   ALBUMIN 4.0 01/19/2023   CALCIUM 8.6 (L) 01/20/2023   ANIONGAP 7 01/20/2023   EGFR 97 01/19/2023   Lab Results  Component Value Date   CHOL 184 01/19/2023   Lab Results  Component Value Date   HDL 48 01/19/2023   Lab Results  Component Value Date   LDLCALC 127 (H) 01/19/2023   Lab Results  Component Value Date   TRIG 46 01/19/2023   Lab Results  Component Value Date   CHOLHDL 3.8 01/19/2023   Lab Results  Component Value Date   HGBA1C 5.5 01/19/2023      Assessment & Plan:  Essential hypertension Assessment & Plan: Controlled The patient is currently asymptomatic in the clinic. She is on a treatment regimen of amlodipine 5 mg daily and lisinopril 40 mg daily. She reports adherence to this regimen and has been compliant with her prescribed treatment. The DASH eating plan was reviewed, and she was encouraged to maintain a low-sodium diet and to increase her physical activity to support ongoing blood pressure management. BP Readings from Last 3 Encounters:  04/21/23 129/82  01/21/23 129/72  01/19/23 128/80      HYPERCHOLESTEROLEMIA Assessment & Plan: The patient reports not taking ezetimibe 10 mg daily but has been making lifestyle changes. I recommend reducing the intake of cholesterol, trans fats, and saturated fats, while increasing the consumption of fruits, vegetables, whole grains, and omega-3 fatty acids. These adjustments will support better management of cholesterol levels and overall cardiovascular health.  Lab Results  Component Value Date   CHOL 184  01/19/2023   HDL 48 01/19/2023   LDLCALC 127 (H) 01/19/2023   TRIG 46 01/19/2023   CHOLHDL 3.8 01/19/2023       IFG (impaired fasting glucose) -     Hemoglobin A1c  Vitamin D deficiency -     VITAMIN D 25 Hydroxy (Vit-D Deficiency, Fractures)  Other specified hypothyroidism -     TSH + free T4  Hyperlipidemia associated with type 2 diabetes mellitus (HCC) -     Lipid panel -     CMP14+EGFR -     CBC with Differential/Platelet  Note: This chart has been completed using Engineer, civil (consulting) software, and while attempts have been made to ensure accuracy, certain words and phrases may not be transcribed as intended.    Follow-up: Return in about 4 months (around 08/21/2023).   Gilmore Laroche, FNP

## 2023-04-22 LAB — CMP14+EGFR
ALT: 11 IU/L (ref 0–32)
AST: 14 IU/L (ref 0–40)
Albumin: 4.4 g/dL (ref 3.9–4.9)
Alkaline Phosphatase: 84 IU/L (ref 44–121)
BUN/Creatinine Ratio: 23 (ref 12–28)
BUN: 16 mg/dL (ref 8–27)
Bilirubin Total: 0.5 mg/dL (ref 0.0–1.2)
CO2: 24 mmol/L (ref 20–29)
Calcium: 9.2 mg/dL (ref 8.7–10.3)
Chloride: 105 mmol/L (ref 96–106)
Creatinine, Ser: 0.71 mg/dL (ref 0.57–1.00)
Globulin, Total: 2.5 g/dL (ref 1.5–4.5)
Glucose: 91 mg/dL (ref 70–99)
Potassium: 4 mmol/L (ref 3.5–5.2)
Sodium: 143 mmol/L (ref 134–144)
Total Protein: 6.9 g/dL (ref 6.0–8.5)
eGFR: 93 mL/min/{1.73_m2} (ref >59)

## 2023-04-22 LAB — CBC WITH DIFFERENTIAL/PLATELET
Basophils Absolute: 0 10*3/uL (ref 0.0–0.2)
Basos: 1 %
EOS (ABSOLUTE): 0.1 10*3/uL (ref 0.0–0.4)
Eos: 3 %
Hematocrit: 40.6 % (ref 34.0–46.6)
Hemoglobin: 13.8 g/dL (ref 11.1–15.9)
Immature Grans (Abs): 0 10*3/uL (ref 0.0–0.1)
Immature Granulocytes: 0 %
Lymphocytes Absolute: 1.3 10*3/uL (ref 0.7–3.1)
Lymphs: 29 %
MCH: 30.5 pg (ref 26.6–33.0)
MCHC: 34 g/dL (ref 31.5–35.7)
MCV: 90 fL (ref 79–97)
Monocytes Absolute: 0.5 10*3/uL (ref 0.1–0.9)
Monocytes: 11 %
Neutrophils Absolute: 2.6 10*3/uL (ref 1.4–7.0)
Neutrophils: 56 %
Platelets: 208 10*3/uL (ref 150–450)
RBC: 4.53 x10E6/uL (ref 3.77–5.28)
RDW: 11.8 % (ref 11.7–15.4)
WBC: 4.6 10*3/uL (ref 3.4–10.8)

## 2023-04-22 LAB — LIPID PANEL
Chol/HDL Ratio: 4 ratio (ref 0.0–4.4)
Cholesterol, Total: 203 mg/dL — ABNORMAL HIGH (ref 100–199)
HDL: 51 mg/dL (ref >39)
LDL Chol Calc (NIH): 143 mg/dL — ABNORMAL HIGH (ref 0–99)
Triglycerides: 50 mg/dL (ref 0–149)
VLDL Cholesterol Cal: 9 mg/dL (ref 5–40)

## 2023-04-22 LAB — HEMOGLOBIN A1C
Est. average glucose Bld gHb Est-mCnc: 105 mg/dL
Hgb A1c MFr Bld: 5.3 % (ref 4.8–5.6)

## 2023-04-22 LAB — TSH+FREE T4
Free T4: 1.07 ng/dL (ref 0.82–1.77)
TSH: 0.609 u[IU]/mL (ref 0.450–4.500)

## 2023-04-22 LAB — VITAMIN D 25 HYDROXY (VIT D DEFICIENCY, FRACTURES): Vit D, 25-Hydroxy: 40.7 ng/mL (ref 30.0–100.0)

## 2023-04-28 ENCOUNTER — Encounter: Payer: Self-pay | Admitting: Family Medicine

## 2023-05-17 ENCOUNTER — Telehealth: Payer: Self-pay | Admitting: Family Medicine

## 2023-05-17 NOTE — Telephone Encounter (Signed)
Patient called in LVM  in regard to R eye discomfort  Pt states that she can see a light in the far right of eye also seeing shadow in eye   Wants to know if she needs to see optometrist   Wants a call back in regard

## 2023-05-18 ENCOUNTER — Other Ambulatory Visit: Payer: Self-pay | Admitting: Family Medicine

## 2023-05-18 DIAGNOSIS — H539 Unspecified visual disturbance: Secondary | ICD-10-CM

## 2023-05-18 NOTE — Progress Notes (Signed)
I spoke with the patient, who reports visual disturbances in her right eye, specifically experiencing flickering lights when turning her head.  She denies severe pain, redness, diplopia, vision loss.  A referral to an ophthalmologist will be placed today for 45 lesion.  The patient has been informed and consents to the plan of care.  She is advised to seek urgent evaluation emergency department if needed.

## 2023-05-22 ENCOUNTER — Other Ambulatory Visit: Payer: Self-pay | Admitting: Family Medicine

## 2023-05-22 DIAGNOSIS — H539 Unspecified visual disturbance: Secondary | ICD-10-CM | POA: Diagnosis not present

## 2023-05-22 DIAGNOSIS — I10 Essential (primary) hypertension: Secondary | ICD-10-CM

## 2023-05-22 DIAGNOSIS — H33311 Horseshoe tear of retina without detachment, right eye: Secondary | ICD-10-CM | POA: Diagnosis not present

## 2023-05-22 DIAGNOSIS — H43811 Vitreous degeneration, right eye: Secondary | ICD-10-CM | POA: Diagnosis not present

## 2023-06-05 DIAGNOSIS — H33311 Horseshoe tear of retina without detachment, right eye: Secondary | ICD-10-CM | POA: Diagnosis not present

## 2023-06-05 DIAGNOSIS — H43811 Vitreous degeneration, right eye: Secondary | ICD-10-CM | POA: Diagnosis not present

## 2023-06-07 ENCOUNTER — Ambulatory Visit (INDEPENDENT_AMBULATORY_CARE_PROVIDER_SITE_OTHER): Payer: Medicare HMO

## 2023-06-07 VITALS — Ht 62.5 in | Wt 126.0 lb

## 2023-06-07 DIAGNOSIS — Z0000 Encounter for general adult medical examination without abnormal findings: Secondary | ICD-10-CM | POA: Diagnosis not present

## 2023-06-07 NOTE — Progress Notes (Signed)
 Because this visit was a virtual/telehealth visit,  certain criteria was not obtained, such a blood pressure, CBG if applicable, and timed get up and go. Any medications not marked as "taking" were not mentioned during the medication reconciliation part of the visit. Any vitals not documented were not able to be obtained due to this being a telehealth visit or patient was unable to self-report a recent blood pressure reading due to a lack of equipment at home via telehealth. Vitals that have been documented are verbally provided by the patient.   Subjective:   Valerie Parker is a 67 y.o. female who presents for Medicare Annual (Subsequent) preventive examination.  Visit Complete: Virtual I connected with  Geoffery Spruce on 06/07/23 by a audio enabled telemedicine application and verified that I am speaking with the correct person using two identifiers.  Patient Location: Home  Provider Location: Home Office  I discussed the limitations of evaluation and management by telemedicine. The patient expressed understanding and agreed to proceed.  Vital Signs: Because this visit was a virtual/telehealth visit, some criteria may be missing or patient reported. Any vitals not documented were not able to be obtained and vitals that have been documented are patient reported.  Patient Medicare AWV questionnaire was completed by the patient on na; I have confirmed that all information answered by patient is correct and no changes since this date.  Cardiac Risk Factors include: advanced age (>86men, >78 women);hypertension     Objective:    Today's Vitals   06/07/23 0855 06/07/23 0856  Weight: 126 lb (57.2 kg)   Height: 5' 2.5" (1.588 m)   PainSc:  0-No pain   Body mass index is 22.68 kg/m.     06/07/2023    8:54 AM 01/20/2023   11:51 PM 05/17/2022   10:36 AM 04/24/2021    9:58 AM 04/22/2020   10:58 AM 11/28/2018   12:05 PM 08/27/2015   11:01 AM  Advanced Directives  Does Patient Have a Medical  Advance Directive? No No No No No No No  Would patient like information on creating a medical advance directive? No - Patient declined No - Patient declined  No - Patient declined No - Patient declined No - Patient declined No - patient declined information    Current Medications (verified) Outpatient Encounter Medications as of 06/07/2023  Medication Sig   amLODipine (NORVASC) 5 MG tablet Take 1 tablet (5 mg total) by mouth daily.   Blood Pressure Monitoring (BLOOD PRESSURE CUFF) MISC 1 Act by Does not apply route daily.   clobetasol cream (TEMOVATE) 0.05 % Use bid for 2 weeks then 2-3 x weekly   conjugated estrogens (PREMARIN) vaginal cream Use daily for 2 weeks then 2-3 x weekly   lisinopril (ZESTRIL) 40 MG tablet TAKE 1 TABLET EVERY DAY   nystatin-triamcinolone ointment (MYCOLOG) Apply 1 application topically 2 (two) times daily.   Vitamin D, Ergocalciferol, (DRISDOL) 1.25 MG (50000 UNIT) CAPS capsule Take 1 capsule (50,000 Units total) by mouth every 7 (seven) days.   No facility-administered encounter medications on file as of 06/07/2023.    Allergies (verified) Patient has no known allergies.   History: Past Medical History:  Diagnosis Date   ABDOMINAL BLOATING 11/18/2008   Qualifier: Diagnosis of  By: Ermalene Searing MD, [name]     Basal cell carcinoma    on face   Hyperlipidemia    Hypertension    Lichen sclerosus et atrophicus 05/27/2020   Low back pain    Past Surgical  History:  Procedure Laterality Date   CESAREAN SECTION     3 times   COLONOSCOPY  2010   repeat 3 years - polyps   TONSILLECTOMY     Family History  Problem Relation Age of Onset   Cancer Mother 23       alcoholism; throat cancer   Diabetes Mother    Alcohol abuse Mother    COPD Father    Alcohol abuse Father    Heart disease Father    Hypertension Father    Diabetes Sister    Hepatitis C Brother    Hepatitis C Sister    Hepatitis C Sister    Mental illness Daughter    Colon cancer Neg Hx     Social History   Socioeconomic History   Marital status: Widowed    Spouse name: Not on file   Number of children: 3   Years of education: Not on file   Highest education level: Some college, no degree  Occupational History   Occupation: CNA  Tobacco Use   Smoking status: Never   Smokeless tobacco: Never  Vaping Use   Vaping status: Never Used  Substance and Sexual Activity   Alcohol use: No    Alcohol/week: 0.0 standard drinks of alcohol   Drug use: No   Sexual activity: Not Currently    Birth control/protection: Post-menopausal  Other Topics Concern   Not on file  Social History Narrative   Lives alone now-husband passed in 2015- from heart condition    3 children-live close by; 4 granchildren   4 cats and 1 dog   Enjoys: out and about, grandchildren      Diet: eats all food groups   Caffeine: 1 pot of coffee daily   Water: 1-2 cups daily       Wears seat belt   Does not use phone while driving    Smoke detectors at home.   Social Determinants of Health   Financial Resource Strain: Low Risk  (06/07/2023)   Overall Financial Resource Strain (CARDIA)    Difficulty of Paying Living Expenses: Not hard at all  Food Insecurity: No Food Insecurity (06/07/2023)   Hunger Vital Sign    Worried About Running Out of Food in the Last Year: Never true    Ran Out of Food in the Last Year: Never true  Transportation Needs: No Transportation Needs (06/07/2023)   PRAPARE - Administrator, Civil Service (Medical): No    Lack of Transportation (Non-Medical): No  Physical Activity: Insufficiently Active (06/07/2023)   Exercise Vital Sign    Days of Exercise per Week: 4 days    Minutes of Exercise per Session: 20 min  Stress: No Stress Concern Present (06/07/2023)   [employer] of Occupational Health - Occupational Stress Questionnaire    Feeling of Stress : Not at all  Social Connections: Moderately Isolated (06/07/2023)   Social Connection and Isolation  Panel [NHANES]    Frequency of Communication with Friends and Family: More than three times a week    Frequency of Social Gatherings with Friends and Family: Twice a week    Attends Religious Services: More than 4 times per year    Active Member of [employer] or Organizations: No    Attends Banker Meetings: Never    Marital Status: Widowed    Tobacco Counseling Counseling given: Yes   Clinical Intake:  Pre-visit preparation completed: Yes  Pain : No/denies pain Pain Score: 0-No pain  BMI - recorded: 22.68 Nutritional Status: BMI of 19-24  Normal Nutritional Risks: None Diabetes: No  How often do you need to have someone help you when you read instructions, pamphlets, or other written materials from your doctor or pharmacy?: 1 - Never  Interpreter Needed?: No  Information entered by ::  Tin Engram, CMA   Activities of Daily Living    06/07/2023    9:05 AM  In your present state of health, do you have any difficulty performing the following activities:  Hearing? 0  Vision? 0  Difficulty concentrating or making decisions? 0  Walking or climbing stairs? 0  Dressing or bathing? 0  Doing errands, shopping? 0  Preparing Food and eating ? N  Using the Toilet? N  In the past six months, have you accidently leaked urine? N  Do you have problems with loss of bowel control? N  Managing your Medications? N  Managing your Finances? N  Housekeeping or managing your Housekeeping? N    Patient Care Team: Gilmore Laroche, FNP as PCP - General (Family Medicine)  Indicate any recent Medical Services you may have received from other than [facility] providers in the past year (date may be approximate).     Assessment:   This is a routine wellness examination for Arli.  Hearing/Vision screen Hearing Screening - Comments:: Patient denies any hearing difficulties.   Vision Screening - Comments:: Wears rx glasses - up to date with routine eye exams with [facility]   Goals Addressed             This Visit's Progress    Patient Stated       Patient stated her goal is to retire       Depression Screen    06/07/2023    9:00 AM 01/19/2023    8:56 AM 10/13/2022   10:02 AM 08/23/2022    9:59 AM 05/17/2022   10:37 AM 10/11/2021   10:48 AM 05/11/2021    9:13 AM  PHQ 2/9 Scores  PHQ - 2 Score 0 0 0 0 0 0 0  PHQ- 9 Score 0 0 0 0       Fall Risk    06/07/2023    9:05 AM 04/21/2023   10:00 AM 01/19/2023    8:56 AM 10/13/2022   10:02 AM 08/23/2022    9:59 AM  Fall Risk   Falls in the past year? 0 0 0 0 0  Number falls in past yr: 0 0 0 0 0  Injury with Fall? 0 0 0 0 0  Risk for fall due to : No Fall Risks No Fall Risks No Fall Risks No Fall Risks No Fall Risks  Follow up Falls prevention discussed Falls evaluation completed Falls evaluation completed Falls evaluation completed Falls evaluation completed    MEDICARE RISK AT HOME: Medicare Risk at Home Any stairs in or around the home?: No If so, are there any without handrails?: No Home free of loose throw rugs in walkways, pet beds, electrical cords, etc?: Yes Adequate lighting in your home to reduce risk of falls?: Yes Life alert?: No Use of a cane, walker or w/c?: No Grab bars in the bathroom?: No Shower chair or bench in shower?: No Elevated toilet seat or a handicapped toilet?: No  TIMED UP AND GO:  Was the test performed?  No    Cognitive Function:    04/24/2021   10:00 AM  MMSE - Mini Mental State Exam  Not  completed: Unable to complete        06/07/2023    8:57 AM 05/17/2022   10:41 AM 04/24/2021   10:01 AM 04/22/2020   10:59 AM  6CIT Screen  What Year? 0 points 0 points 0 points 0 points  What month? 0 points 0 points 0 points 0 points  What time? 0 points 0 points 0 points 0 points  Count back from 20 0 points 0 points 0 points 0 points  Months in reverse 0 points 0 points 0 points 0 points  Repeat phrase 0 points 0 points 0 points 0 points  Total  Score 0 points 0 points 0 points 0 points    Immunizations Immunization History  Administered Date(s) Administered   Influenza Whole 05/22/2008   Influenza,trivalent, recombinat, inj, PF 06/20/2022   Influenza-Unspecified 05/14/2018   PNEUMOCOCCAL CONJUGATE-20 10/13/2022   Td 08/23/2007   Tdap 05/11/2021   Zoster Recombinant(Shingrix) 04/19/2023    TDAP status: Up to date  Flu Vaccine status: Due, Education has been provided regarding the importance of this vaccine. Advised may receive this vaccine at local pharmacy or Health Dept. Aware to provide a copy of the vaccination record if obtained from local pharmacy or Health Dept. Verbalized acceptance and understanding.  Pneumococcal vaccine status: Up to date  Covid-19 vaccine status: Information provided on how to obtain vaccines.   Qualifies for Shingles Vaccine? Yes   Zostavax completed No   Shingrix Completed?: Yes  Screening Tests Health Maintenance  Topic Date Due   Diabetic kidney evaluation - Urine ACR  Never done   INFLUENZA VACCINE  03/23/2023   COVID-19 Vaccine (1 - 2023-24 season) Never done   Medicare Annual Wellness (AWV)  05/18/2023   Zoster Vaccines- Shingrix (2 of 2) 06/14/2023   Diabetic kidney evaluation - eGFR measurement  04/20/2024   MAMMOGRAM  10/23/2024   DEXA SCAN  10/23/2024   Colonoscopy  09/08/2025   DTaP/Tdap/Td (3 - Td or Tdap) 05/12/2031   Pneumonia Vaccine 71+ Years old  Completed   Hepatitis C Screening  Completed   HPV VACCINES  Aged Out    Health Maintenance  Health Maintenance Due  Topic Date Due   Diabetic kidney evaluation - Urine ACR  Never done   INFLUENZA VACCINE  03/23/2023   COVID-19 Vaccine (1 - 2023-24 season) Never done   Medicare Annual Wellness (AWV)  05/18/2023    Colorectal cancer screening: Type of screening: Colonoscopy. Completed 09/09/2015. Repeat every 10 years  Mammogram status: Completed 10/24/2022. Repeat every year  Bone Density status: Completed  10/24/2022. Results reflect: Bone density results: OSTEOPENIA. Repeat every 2 years.  Lung Cancer Screening: (Low Dose CT Chest recommended if Age 39-80 years, 20 pack-year currently smoking OR have quit w/in 15years.) does not qualify.   Lung Cancer Screening Referral: na  Additional Screening:  Hepatitis C Screening: does not qualify; Completed 09/15/2014  Vision Screening: Recommended annual ophthalmology exams for early detection of glaucoma and other disorders of the eye. Is the patient up to date with their annual eye exam?  Yes  Who is the provider or what is the name of the office in which the patient attends annual eye exams? [facility] If pt is not established with a provider, would they like to be referred to a provider to establish care? No .   Dental Screening: Recommended annual dental exams for proper oral hygiene  Diabetic Foot Exam: na  Community Resource Referral / Chronic Care Management: CRR required this visit?  No  CCM required this visit?  No     Plan:     I have personally reviewed and noted the following in the patient's chart:   Medical and social history Use of alcohol, tobacco or illicit drugs  Current medications and supplements including opioid prescriptions. Patient is not currently taking opioid prescriptions. Functional ability and status Nutritional status Physical activity Advanced directives List of other physicians Hospitalizations, surgeries, and ER visits in previous 12 months Vitals Screenings to include cognitive, depression, and falls Referrals and appointments  In addition, I have reviewed and discussed with patient certain preventive protocols, quality metrics, and best practice recommendations. A written personalized care plan for preventive services as well as general preventive health recommendations were provided to patient.     Jordan Hawks Dejane Scheibe, CMA   06/07/2023   After Visit Summary: (MyChart) Due to this being a telephonic  visit, the after visit summary with patients personalized plan was offered to patient via MyChart

## 2023-06-07 NOTE — Patient Instructions (Signed)
Valerie Parker , Thank you for taking time to come for your Medicare Wellness Visit. I appreciate your ongoing commitment to your health goals. Please review the following plan we discussed and let me know if I can assist you in the future.   Referrals/Orders/Follow-Ups/Clinician Recommendations:  Next Medicare Annual Wellness Visit: June 10, 2024 at 8am virtual visit  This is a list of the screening recommended for you and due dates:  Health Maintenance  Topic Date Due   Yearly kidney health urinalysis for diabetes  Never done   Flu Shot  03/23/2023   COVID-19 Vaccine (1 - 2023-24 season) Never done   Zoster (Shingles) Vaccine (2 of 2) 06/14/2023   Mammogram  10/24/2023   Yearly kidney function blood test for diabetes  04/20/2024   Medicare Annual Wellness Visit  06/06/2024   DEXA scan (bone density measurement)  10/23/2024   Colon Cancer Screening  09/08/2025   DTaP/Tdap/Td vaccine (3 - Td or Tdap) 05/12/2031   Pneumonia Vaccine  Completed   Hepatitis C Screening  Completed   HPV Vaccine  Aged Out    Advanced directives: (Declined) Advance directive discussed with you today. Even though you declined this today, please call our office should you change your mind, and we can give you the proper paperwork for you to fill out.  Next Medicare Annual Wellness Visit scheduled for next year: Yes  Preventive Care 17 Years and Older, Female Preventive care refers to lifestyle choices and visits with your health care provider that can promote health and wellness. Preventive care visits are also called wellness exams. What can I expect for my preventive care visit? Counseling Your health care provider may ask you questions about your: Medical history, including: Past medical problems. Family medical history. Pregnancy and menstrual history. History of falls. Current health, including: Memory and ability to understand (cognition). Emotional well-being. Home life and relationship  well-being. Sexual activity and sexual health. Lifestyle, including: Alcohol, nicotine or tobacco, and drug use. Access to firearms. Diet, exercise, and sleep habits. Work and work Astronomer. Sunscreen use. Safety issues such as seatbelt and bike helmet use. Physical exam Your health care provider will check your: Height and weight. These may be used to calculate your BMI (body mass index). BMI is a measurement that tells if you are at a healthy weight. Waist circumference. This measures the distance around your waistline. This measurement also tells if you are at a healthy weight and may help predict your risk of certain diseases, such as type 2 diabetes and high blood pressure. Heart rate and blood pressure. Body temperature. Skin for abnormal spots. What immunizations do I need?  Vaccines are usually given at various ages, according to a schedule. Your health care provider will recommend vaccines for you based on your age, medical history, and lifestyle or other factors, such as travel or where you work. What tests do I need? Screening Your health care provider may recommend screening tests for certain conditions. This may include: Lipid and cholesterol levels. Hepatitis C test. Hepatitis B test. HIV (human immunodeficiency virus) test. STI (sexually transmitted infection) testing, if you are at risk. Lung cancer screening. Colorectal cancer screening. Diabetes screening. This is done by checking your blood sugar (glucose) after you have not eaten for a while (fasting). Mammogram. Talk with your health care provider about how often you should have regular mammograms. BRCA-related cancer screening. This may be done if you have a family history of breast, ovarian, tubal, or peritoneal cancers. Bone density  scan. This is done to screen for osteoporosis. Talk with your health care provider about your test results, treatment options, and if necessary, the need for more tests. Follow  these instructions at home: Eating and drinking  Eat a diet that includes fresh fruits and vegetables, whole grains, lean protein, and low-fat dairy products. Limit your intake of foods with high amounts of sugar, saturated fats, and salt. Take vitamin and mineral supplements as recommended by your health care provider. Do not drink alcohol if your health care provider tells you not to drink. If you drink alcohol: Limit how much you have to 0-1 drink a day. Know how much alcohol is in your drink. In the [place] one drink equals one 12 oz bottle of beer (355 mL), one 5 oz glass of wine (148 mL), or one 1 oz glass of hard liquor (44 mL). Lifestyle Brush your teeth every morning and night with fluoride toothpaste. Floss one time each day. Exercise for at least 30 minutes 5 or more days each week. Do not use any products that contain nicotine or tobacco. These products include cigarettes, chewing tobacco, and vaping devices, such as e-cigarettes. If you need help quitting, ask your health care provider. Do not use drugs. If you are sexually active, practice safe sex. Use a condom or other form of protection in order to prevent STIs. Take aspirin only as told by your health care provider. Make sure that you understand how much to take and what form to take. Work with your health care provider to find out whether it is safe and beneficial for you to take aspirin daily. Ask your health care provider if you need to take a cholesterol-lowering medicine (statin). Find healthy ways to manage stress, such as: Meditation, yoga, or listening to music. Journaling. Talking to a trusted person. Spending time with friends and family. Minimize exposure to UV radiation to reduce your risk of skin cancer. Safety Always wear your seat belt while driving or riding in a vehicle. Do not drive: If you have been drinking alcohol. Do not ride with someone who has been drinking. When you are tired or distracted. While  texting. If you have been using any mind-altering substances or drugs. Wear a helmet and other protective equipment during sports activities. If you have firearms in your house, make sure you follow all gun safety procedures. What's next? Visit your health care provider once a year for an annual wellness visit. Ask your health care provider how often you should have your eyes and teeth checked. Stay up to date on all vaccines. This information is not intended to replace advice given to you by your health care provider. Make sure you discuss any questions you have with your health care provider. Document Revised: 02/03/2021 Document Reviewed: 02/03/2021 Elsevier Patient Education  2024 [employer]. Understanding Your Risk for Falls Millions of people have serious injuries from falls each year. It is important to understand your risk of falling. Talk with your health care provider about your risk and what you can do to lower it. If you do have a serious fall, make sure to tell your provider. Falling once raises your risk of falling again. How can falls affect me? Serious injuries from falls are common. These include: Broken bones, such as hip fractures. Head injuries, such as traumatic brain injuries (TBI) or concussions. A fear of falling can cause you to avoid activities and stay at home. This can make your muscles weaker and raise your risk  for a fall. What can increase my risk? There are a number of risk factors that increase your risk for falling. The more risk factors you have, the higher your risk of falling. Serious injuries from a fall happen most often to people who are older than 68 years old. Teenagers and young adults ages 60-29 are also at higher risk. Common risk factors include: Weakness in the lower body. Being generally weak or confused due to long-term (chronic) illness. Dizziness or balance problems. Poor vision. Medicines that cause dizziness or drowsiness. These may  include: Medicines for your blood pressure, heart, anxiety, insomnia, or swelling (edema). Pain medicines. Muscle relaxants. Other risk factors include: Drinking alcohol. Having had a fall in the past. Having foot pain or wearing improper footwear. Working at a dangerous job. Having any of the following in your home: Tripping hazards, such as floor clutter or loose rugs. Poor lighting. Pets. Having dementia or memory loss. What actions can I take to lower my risk of falling?     Physical activity Stay physically fit. Do strength and balance exercises. Consider taking a regular class to build strength and balance. Yoga and tai chi are good options. Vision Have your eyes checked every year and your prescription for glasses or contacts updated as needed. Shoes and walking aids Wear non-skid shoes. Wear shoes that have rubber soles and low heels. Do not wear high heels. Do not walk around the house in socks or slippers. Use a cane or walker as told by your provider. Home safety Attach secure railings on both sides of your stairs. Install grab bars for your bathtub, shower, and toilet. Use a non-skid mat in your bathtub or shower. Attach bath mats securely with double-sided, non-slip rug tape. Use good lighting in all rooms. Keep a flashlight near your bed. Make sure there is a clear path from your bed to the bathroom. Use night-lights. Do not use throw rugs. Make sure all carpeting is taped or tacked down securely. Remove all clutter from walkways and stairways, including extension cords. Repair uneven or broken steps and floors. Avoid walking on icy or slippery surfaces. Walk on the grass instead of on icy or slick sidewalks. Use ice melter to get rid of ice on walkways in the winter. Use a cordless phone. Questions to ask your health care provider Can you help me check my risk for a fall? Do any of my medicines make me more likely to fall? Should I take a vitamin D  supplement? What exercises can I do to improve my strength and balance? Should I make an appointment to have my vision checked? Do I need a bone density test to check for weak bones (osteoporosis)? Would it help to use a cane or a walker? Where to find more information Centers for Disease Control and Prevention, STEADI: TonerPromos.no Community-Based Fall Prevention Programs: TonerPromos.no [employer] on Aging: BaseRingTones.pl Contact a health care provider if: You fall at home. You are afraid of falling at home. You feel weak, drowsy, or dizzy. This information is not intended to replace advice given to you by your health care provider. Make sure you discuss any questions you have with your health care provider. Document Revised: 04/11/2022 Document Reviewed: 04/11/2022 Elsevier Patient Education  2024 [employer].

## 2023-06-16 ENCOUNTER — Encounter: Payer: Self-pay | Admitting: Family Medicine

## 2023-08-21 ENCOUNTER — Ambulatory Visit (INDEPENDENT_AMBULATORY_CARE_PROVIDER_SITE_OTHER): Payer: Medicare HMO | Admitting: Family Medicine

## 2023-08-21 ENCOUNTER — Encounter: Payer: Self-pay | Admitting: Family Medicine

## 2023-08-21 VITALS — BP 143/86 | HR 65 | Ht 63.0 in | Wt 129.1 lb

## 2023-08-21 DIAGNOSIS — E7800 Pure hypercholesterolemia, unspecified: Secondary | ICD-10-CM

## 2023-08-21 DIAGNOSIS — E7849 Other hyperlipidemia: Secondary | ICD-10-CM | POA: Diagnosis not present

## 2023-08-21 DIAGNOSIS — R7301 Impaired fasting glucose: Secondary | ICD-10-CM

## 2023-08-21 DIAGNOSIS — I10 Essential (primary) hypertension: Secondary | ICD-10-CM

## 2023-08-21 DIAGNOSIS — E038 Other specified hypothyroidism: Secondary | ICD-10-CM | POA: Diagnosis not present

## 2023-08-21 DIAGNOSIS — E559 Vitamin D deficiency, unspecified: Secondary | ICD-10-CM

## 2023-08-21 MED ORDER — LISINOPRIL 40 MG PO TABS: 40.0000 mg | ORAL_TABLET | Freq: Every day | ORAL | 1 refills | Status: DC

## 2023-08-21 MED ORDER — AMLODIPINE BESYLATE 5 MG PO TABS: 5.0000 mg | ORAL_TABLET | Freq: Every day | ORAL | 1 refills | Status: DC

## 2023-08-21 NOTE — Assessment & Plan Note (Signed)
Uncontrolled Blood Pressure The patient reports that she did not take her blood pressure medication today.  She is currently on amlodipine 5 mg and lisinopril 40 mg. She is asymptomatic at this time during the clinic visit. Encourage adherence to her medication regimen, along with following a low-sodium diet and increasing physical activity.  The patient verbalized understanding of the plan of care and is aware of the recommendations. BP Readings from Last 3 Encounters:  08/21/23 (!) 143/86  04/21/23 129/82  01/21/23 129/72

## 2023-08-21 NOTE — Progress Notes (Signed)
Established Patient Office Visit  Subjective:  Patient ID: Valerie Parker, female    DOB: [birth year]  Age: 67 y.o. MRN: [id number]  CC:  Chief Complaint  Patient presents with   Care Management    4 month f/u    HPI Valerie Parker is a 67 y.o. female with past medical history of essential hypertension, hyperlipidemia presents for f/u of  chronic medical conditions. For the details of today's visit, please refer to the assessment and plan.     Past Medical History:  Diagnosis Date   ABDOMINAL BLOATING 11/18/2008   Qualifier: Diagnosis of  By: Ermalene Searing MD, [name]     Basal cell carcinoma    on face   Hyperlipidemia    Hypertension    Lichen sclerosus et atrophicus 05/27/2020   Low back pain     Past Surgical History:  Procedure Laterality Date   CESAREAN SECTION     3 times   COLONOSCOPY  2010   repeat 3 years - polyps   TONSILLECTOMY      Family History  Problem Relation Age of Onset   Cancer Mother 58       alcoholism; throat cancer   Diabetes Mother    Alcohol abuse Mother    COPD Father    Alcohol abuse Father    Heart disease Father    Hypertension Father    Diabetes Sister    Hepatitis C Brother    Hepatitis C Sister    Hepatitis C Sister    Mental illness Daughter    Colon cancer Neg Hx     Social History   Socioeconomic History   Marital status: Widowed    Spouse name: Not on file   Number of children: 3   Years of education: Not on file   Highest education level: Some college, no degree  Occupational History   Occupation: CNA  Tobacco Use   Smoking status: Never   Smokeless tobacco: Never  Vaping Use   Vaping status: Never Used  Substance and Sexual Activity   Alcohol use: No    Alcohol/week: 0.0 standard drinks of alcohol   Drug use: No   Sexual activity: Not Currently    Birth control/protection: Post-menopausal  Other Topics Concern   Not on file  Social History Narrative   Lives alone now-husband passed in 2015- from heart condition     3 children-live close by; 4 granchildren   4 cats and 1 dog   Enjoys: out and about, grandchildren      Diet: eats all food groups   Caffeine: 1 pot of coffee daily   Water: 1-2 cups daily       Wears seat belt   Does not use phone while driving    Smoke detectors at home.   Social Drivers of Corporate investment banker Strain: Low Risk  (06/07/2023)   Overall Financial Resource Strain (CARDIA)    Difficulty of Paying Living Expenses: Not hard at all  Food Insecurity: No Food Insecurity (06/07/2023)   Hunger Vital Sign    Worried About Running Out of Food in the Last Year: Never true    Ran Out of Food in the Last Year: Never true  Transportation Needs: No Transportation Needs (06/07/2023)   PRAPARE - Administrator, Civil Service (Medical): No    Lack of Transportation (Non-Medical): No  Physical Activity: Insufficiently Active (06/07/2023)   Exercise Vital Sign    Days of Exercise per  Week: 4 days    Minutes of Exercise per Session: 20 min  Stress: No Stress Concern Present (06/07/2023)   [employer] of Occupational Health - Occupational Stress Questionnaire    Feeling of Stress : Not at all  Social Connections: Moderately Isolated (06/07/2023)   Social Connection and Isolation Panel [NHANES]    Frequency of Communication with Friends and Family: More than three times a week    Frequency of Social Gatherings with Friends and Family: Twice a week    Attends Religious Services: More than 4 times per year    Active Member of [employer] or Organizations: No    Attends Banker Meetings: Never    Marital Status: Widowed  Intimate Partner Violence: Not At Risk (06/07/2023)   Humiliation, Afraid, Rape, and Kick questionnaire    Fear of Current or Ex-Partner: No    Emotionally Abused: No    Physically Abused: No    Sexually Abused: No    Outpatient Medications Prior to Visit  Medication Sig Dispense Refill   Blood Pressure Monitoring (BLOOD  PRESSURE CUFF) MISC 1 Act by Does not apply route daily. 1 each 0   Vitamin D, Ergocalciferol, (DRISDOL) 1.25 MG (50000 UNIT) CAPS capsule Take 1 capsule (50,000 Units total) by mouth every 7 (seven) days. 10 capsule 1   amLODipine (NORVASC) 5 MG tablet Take 1 tablet (5 mg total) by mouth daily. 90 tablet 1   clobetasol cream (TEMOVATE) 0.05 % Use bid for 2 weeks then 2-3 x weekly 45 g 2   conjugated estrogens (PREMARIN) vaginal cream Use daily for 2 weeks then 2-3 x weekly 24 g 12   lisinopril (ZESTRIL) 40 MG tablet TAKE 1 TABLET EVERY DAY 90 tablet 3   nystatin-triamcinolone ointment (MYCOLOG) Apply 1 application topically 2 (two) times daily. 30 g 0   No facility-administered medications prior to visit.    No Known Allergies  ROS Review of Systems  Constitutional:  Negative for chills and fever.  Eyes:  Negative for visual disturbance.  Respiratory:  Negative for chest tightness and shortness of breath.   Neurological:  Negative for dizziness and headaches.      Objective:    Physical Exam HENT:     Head: Normocephalic.     Mouth/Throat:     Mouth: Mucous membranes are moist.  Cardiovascular:     Rate and Rhythm: Normal rate.     Heart sounds: Normal heart sounds.  Pulmonary:     Effort: Pulmonary effort is normal.     Breath sounds: Normal breath sounds.  Neurological:     Mental Status: She is alert.     BP (!) 143/86 (BP Location: Left Arm)   Pulse 65   Ht 5\' 3"  (1.6 m)   Wt 129 lb 1.3 oz (58.6 kg)   SpO2 98%   BMI 22.87 kg/m  Wt Readings from Last 3 Encounters:  08/21/23 129 lb 1.3 oz (58.6 kg)  06/07/23 126 lb (57.2 kg)  04/21/23 127 lb 1.9 oz (57.7 kg)    Lab Results  Component Value Date   TSH 0.609 04/21/2023   Lab Results  Component Value Date   WBC 4.6 04/21/2023   HGB 13.8 04/21/2023   HCT 40.6 04/21/2023   MCV 90 04/21/2023   PLT 208 04/21/2023   Lab Results  Component Value Date   NA 143 04/21/2023   K 4.0 04/21/2023   CO2 24  04/21/2023   GLUCOSE 91 04/21/2023   BUN 16  04/21/2023   CREATININE 0.71 04/21/2023   BILITOT 0.5 04/21/2023   ALKPHOS 84 04/21/2023   AST 14 04/21/2023   ALT 11 04/21/2023   PROT 6.9 04/21/2023   ALBUMIN 4.4 04/21/2023   CALCIUM 9.2 04/21/2023   ANIONGAP 7 01/20/2023   EGFR 93 04/21/2023   Lab Results  Component Value Date   CHOL 203 (H) 04/21/2023   Lab Results  Component Value Date   HDL 51 04/21/2023   Lab Results  Component Value Date   LDLCALC 143 (H) 04/21/2023   Lab Results  Component Value Date   TRIG 50 04/21/2023   Lab Results  Component Value Date   CHOLHDL 4.0 04/21/2023   Lab Results  Component Value Date   HGBA1C 5.3 04/21/2023      Assessment & Plan:  Essential hypertension Assessment & Plan: Uncontrolled Blood Pressure The patient reports that she did not take her blood pressure medication today.  She is currently on amlodipine 5 mg and lisinopril 40 mg. She is asymptomatic at this time during the clinic visit. Encourage adherence to her medication regimen, along with following a low-sodium diet and increasing physical activity.  The patient verbalized understanding of the plan of care and is aware of the recommendations. BP Readings from Last 3 Encounters:  08/21/23 (!) 143/86  04/21/23 129/82  01/21/23 129/72         Orders: -     amLODIPine Besylate; Take 1 tablet (5 mg total) by mouth daily.  Dispense: 90 tablet; Refill: 1 -     Lisinopril; Take 1 tablet (40 mg total) by mouth daily.  Dispense: 90 tablet; Refill: 1 -     Microalbumin / creatinine urine ratio  HYPERCHOLESTEROLEMIA Assessment & Plan: The patient was encouraged to make lifestyle changes, including avoiding simple carbohydrates such as cakes, sweet desserts, ice cream, soda (diet or regular), sweet tea, candies, chips, cookies, store-bought juices, excessive alcohol (more than 1-2 drinks per day), lemonade, artificial sweeteners, donuts, coffee creamers, and  sugar-free products. Additionally, reducing the consumption of greasy, fatty foods and increasing physical activity were recommended. The patient verbalized understanding and is aware of the plan of care.   Orders: -     Lipid panel -     CMP14+EGFR -     CBC with Differential/Platelet  IFG (impaired fasting glucose) -     Hemoglobin A1c  Vitamin D deficiency -     VITAMIN D 25 Hydroxy (Vit-D Deficiency, Fractures)  TSH (thyroid-stimulating hormone deficiency) -     TSH + free T4  Note: This chart has been completed using Engineer, civil (consulting) software, and while attempts have been made to ensure accuracy, certain words and phrases may not be transcribed as intended.    Follow-up: Return in about 4 months (around 12/20/2023).   Gilmore Laroche, FNP

## 2023-08-21 NOTE — Patient Instructions (Addendum)
I appreciate the opportunity to provide care to you today!    Follow up:  4 months  Labs: please stop by the lab today to get your blood drawn (CBC, CMP, TSH, Lipid profile, HgA1c, Vit D)   Here are some foods to avoid or reduce in your diet to help manage cholesterol levels:  Fried Foods:Deep-fried items such as french fries, fried chicken, and fried snacks are high in unhealthy fats and can raise LDL (bad) cholesterol levels. Processed Meats:Foods like bacon, sausage, hot dogs, and deli meats are often high in saturated fat and cholesterol. Full-Fat Dairy Products:Whole milk, full-fat yogurt, butter, cream, and cheese are rich in saturated fats, which can increase cholesterol levels. Baked Goods and Sweets:Pastries, cakes, cookies, and donuts often contain trans fats and added sugars, which can raise LDL cholesterol and lower HDL (good) cholesterol. Red Meat:Beef, lamb, and pork are high in saturated fat. Lean cuts or plant-based protein alternatives are better options. Lard and Shortening:Used in some baked goods, lard and shortening are high in trans fats and should be avoided. Fast Food:Many fast food items are cooked with unhealthy oils and contain high amounts of saturated and trans fats. Processed Snacks:Chips, crackers, and certain microwave popcorns can contain trans fats and high levels of unhealthy oils. Shellfish:While nutritious in other ways, some shellfish like shrimp, lobster, and crab are high in cholesterol. They should be consumed in moderation. Coconut and Palm Oils:these oils are high in saturated fat and can raise cholesterol levels when used in cooking or baking.       Please continue to a heart-healthy diet and increase your physical activities. Try to exercise for at least five days a week.    It was a pleasure to see you and I look forward to continuing to work together on your health and well-being. Please do not hesitate to call the office if you need  care or have questions about your care.  In case of emergency, please visit the Emergency Department for urgent care, or contact our clinic at [phone] to schedule an appointment. We're here to help you!   Have a wonderful day and week. With Gratitude, Gilmore Laroche MSN, FNP-BC

## 2023-08-21 NOTE — Assessment & Plan Note (Signed)
The patient was encouraged to make lifestyle changes, including avoiding simple carbohydrates such as cakes, sweet desserts, ice cream, soda (diet or regular), sweet tea, candies, chips, cookies, store-bought juices, excessive alcohol (more than 1-2 drinks per day), lemonade, artificial sweeteners, donuts, coffee creamers, and sugar-free products. Additionally, reducing the consumption of greasy, fatty foods and increasing physical activity were recommended. The patient verbalized understanding and is aware of the plan of care.

## 2023-08-22 NOTE — Progress Notes (Signed)
Please inform the patient that her hemoglobin A1c is 5.7, indicating that she is prediabetic. I recommend decreasing her intake of high sugar foods and beverages, along with increasing physical activity. A job well done as her cholesterol level has decreased. I recommend that she continue with lifestyle changes by decreasing her intake of greasy, fatty, and starchy foods, and increasing physical activity. All other labs are stable.

## 2023-08-23 LAB — CMP14+EGFR
ALT: 9 [IU]/L (ref 0–32)
AST: 13 [IU]/L (ref 0–40)
Albumin: 4.2 g/dL (ref 3.9–4.9)
Alkaline Phosphatase: 83 [IU]/L (ref 44–121)
BUN/Creatinine Ratio: 19 (ref 12–28)
BUN: 13 mg/dL (ref 8–27)
Bilirubin Total: 0.4 mg/dL (ref 0.0–1.2)
CO2: 26 mmol/L (ref 20–29)
Calcium: 9.5 mg/dL (ref 8.7–10.3)
Chloride: 106 mmol/L (ref 96–106)
Creatinine, Ser: 0.68 mg/dL (ref 0.57–1.00)
Globulin, Total: 2.4 g/dL (ref 1.5–4.5)
Glucose: 96 mg/dL (ref 70–99)
Potassium: 5 mmol/L (ref 3.5–5.2)
Sodium: 143 mmol/L (ref 134–144)
Total Protein: 6.6 g/dL (ref 6.0–8.5)
eGFR: 95 mL/min/{1.73_m2} (ref >59)

## 2023-08-23 LAB — CBC WITH DIFFERENTIAL/PLATELET
Basophils Absolute: 0 10*3/uL (ref 0.0–0.2)
Basos: 0 %
EOS (ABSOLUTE): 0.1 10*3/uL (ref 0.0–0.4)
Eos: 2 %
Hematocrit: 40.5 % (ref 34.0–46.6)
Hemoglobin: 13.6 g/dL (ref 11.1–15.9)
Immature Grans (Abs): 0 10*3/uL (ref 0.0–0.1)
Immature Granulocytes: 0 %
Lymphocytes Absolute: 1.5 10*3/uL (ref 0.7–3.1)
Lymphs: 21 %
MCH: 30.8 pg (ref 26.6–33.0)
MCHC: 33.6 g/dL (ref 31.5–35.7)
MCV: 92 fL (ref 79–97)
Monocytes Absolute: 0.6 10*3/uL (ref 0.1–0.9)
Monocytes: 9 %
Neutrophils Absolute: 4.6 10*3/uL (ref 1.4–7.0)
Neutrophils: 68 %
Platelets: 222 10*3/uL (ref 150–450)
RBC: 4.42 x10E6/uL (ref 3.77–5.28)
RDW: 12.2 % (ref 11.7–15.4)
WBC: 6.8 10*3/uL (ref 3.4–10.8)

## 2023-08-23 LAB — MICROALBUMIN / CREATININE URINE RATIO: Creatinine, Urine: 64.2 mg/dL

## 2023-08-23 LAB — LIPID PANEL
Chol/HDL Ratio: 2.9 {ratio} (ref 0.0–4.4)
Cholesterol, Total: 178 mg/dL (ref 100–199)
HDL: 61 mg/dL (ref >39)
LDL Chol Calc (NIH): 105 mg/dL — ABNORMAL HIGH (ref 0–99)
Triglycerides: 61 mg/dL (ref 0–149)
VLDL Cholesterol Cal: 12 mg/dL (ref 5–40)

## 2023-08-23 LAB — HEMOGLOBIN A1C
Est. average glucose Bld gHb Est-mCnc: 117 mg/dL
Hgb A1c MFr Bld: 5.7 % — ABNORMAL HIGH (ref 4.8–5.6)

## 2023-08-23 LAB — TSH+FREE T4
Free T4: 1.13 ng/dL (ref 0.82–1.77)
TSH: 0.522 u[IU]/mL (ref 0.450–4.500)

## 2023-08-23 LAB — VITAMIN D 25 HYDROXY (VIT D DEFICIENCY, FRACTURES): Vit D, 25-Hydroxy: 30.1 ng/mL (ref 30.0–100.0)

## 2023-09-18 DIAGNOSIS — H33311 Horseshoe tear of retina without detachment, right eye: Secondary | ICD-10-CM | POA: Diagnosis not present

## 2023-09-18 DIAGNOSIS — H43811 Vitreous degeneration, right eye: Secondary | ICD-10-CM | POA: Diagnosis not present

## 2023-09-18 DIAGNOSIS — H4311 Vitreous hemorrhage, right eye: Secondary | ICD-10-CM | POA: Diagnosis not present

## 2023-09-18 DIAGNOSIS — H25811 Combined forms of age-related cataract, right eye: Secondary | ICD-10-CM | POA: Diagnosis not present

## 2023-09-21 DIAGNOSIS — H33311 Horseshoe tear of retina without detachment, right eye: Secondary | ICD-10-CM | POA: Diagnosis not present

## 2023-09-21 DIAGNOSIS — I10 Essential (primary) hypertension: Secondary | ICD-10-CM | POA: Diagnosis not present

## 2023-09-21 DIAGNOSIS — H33301 Unspecified retinal break, right eye: Secondary | ICD-10-CM | POA: Diagnosis not present

## 2023-09-21 DIAGNOSIS — H4311 Vitreous hemorrhage, right eye: Secondary | ICD-10-CM | POA: Diagnosis not present

## 2023-09-22 DIAGNOSIS — H25811 Combined forms of age-related cataract, right eye: Secondary | ICD-10-CM | POA: Diagnosis not present

## 2023-09-22 DIAGNOSIS — H33311 Horseshoe tear of retina without detachment, right eye: Secondary | ICD-10-CM | POA: Diagnosis not present

## 2023-09-22 DIAGNOSIS — H43811 Vitreous degeneration, right eye: Secondary | ICD-10-CM | POA: Diagnosis not present

## 2023-09-22 DIAGNOSIS — H4311 Vitreous hemorrhage, right eye: Secondary | ICD-10-CM | POA: Diagnosis not present

## 2023-09-29 DIAGNOSIS — H25811 Combined forms of age-related cataract, right eye: Secondary | ICD-10-CM | POA: Diagnosis not present

## 2023-09-29 DIAGNOSIS — H43811 Vitreous degeneration, right eye: Secondary | ICD-10-CM | POA: Diagnosis not present

## 2023-09-29 DIAGNOSIS — H4311 Vitreous hemorrhage, right eye: Secondary | ICD-10-CM | POA: Diagnosis not present

## 2023-09-29 DIAGNOSIS — H33311 Horseshoe tear of retina without detachment, right eye: Secondary | ICD-10-CM | POA: Diagnosis not present

## 2023-10-23 DIAGNOSIS — H25811 Combined forms of age-related cataract, right eye: Secondary | ICD-10-CM | POA: Diagnosis not present

## 2023-10-23 DIAGNOSIS — H4311 Vitreous hemorrhage, right eye: Secondary | ICD-10-CM | POA: Diagnosis not present

## 2023-10-23 DIAGNOSIS — H33311 Horseshoe tear of retina without detachment, right eye: Secondary | ICD-10-CM | POA: Diagnosis not present

## 2023-10-23 DIAGNOSIS — H43811 Vitreous degeneration, right eye: Secondary | ICD-10-CM | POA: Diagnosis not present

## 2023-11-10 DIAGNOSIS — H4311 Vitreous hemorrhage, right eye: Secondary | ICD-10-CM | POA: Diagnosis not present

## 2023-11-10 DIAGNOSIS — H33311 Horseshoe tear of retina without detachment, right eye: Secondary | ICD-10-CM | POA: Diagnosis not present

## 2023-11-10 DIAGNOSIS — H25811 Combined forms of age-related cataract, right eye: Secondary | ICD-10-CM | POA: Diagnosis not present

## 2023-11-10 DIAGNOSIS — H43812 Vitreous degeneration, left eye: Secondary | ICD-10-CM | POA: Diagnosis not present

## 2023-12-08 DIAGNOSIS — H25811 Combined forms of age-related cataract, right eye: Secondary | ICD-10-CM | POA: Diagnosis not present

## 2023-12-08 DIAGNOSIS — H33311 Horseshoe tear of retina without detachment, right eye: Secondary | ICD-10-CM | POA: Diagnosis not present

## 2023-12-08 DIAGNOSIS — H4311 Vitreous hemorrhage, right eye: Secondary | ICD-10-CM | POA: Diagnosis not present

## 2023-12-08 DIAGNOSIS — H43812 Vitreous degeneration, left eye: Secondary | ICD-10-CM | POA: Diagnosis not present

## 2023-12-22 ENCOUNTER — Ambulatory Visit (INDEPENDENT_AMBULATORY_CARE_PROVIDER_SITE_OTHER): Payer: Medicare HMO | Admitting: Family Medicine

## 2023-12-22 ENCOUNTER — Encounter: Payer: Self-pay | Admitting: Family Medicine

## 2023-12-22 VITALS — BP 133/78 | HR 82 | Ht 63.0 in | Wt 129.1 lb

## 2023-12-22 DIAGNOSIS — R7301 Impaired fasting glucose: Secondary | ICD-10-CM | POA: Diagnosis not present

## 2023-12-22 DIAGNOSIS — M25521 Pain in right elbow: Secondary | ICD-10-CM | POA: Insufficient documentation

## 2023-12-22 DIAGNOSIS — E038 Other specified hypothyroidism: Secondary | ICD-10-CM

## 2023-12-22 DIAGNOSIS — E7800 Pure hypercholesterolemia, unspecified: Secondary | ICD-10-CM | POA: Diagnosis not present

## 2023-12-22 DIAGNOSIS — E559 Vitamin D deficiency, unspecified: Secondary | ICD-10-CM | POA: Diagnosis not present

## 2023-12-22 DIAGNOSIS — I10 Essential (primary) hypertension: Secondary | ICD-10-CM

## 2023-12-22 DIAGNOSIS — E7849 Other hyperlipidemia: Secondary | ICD-10-CM | POA: Diagnosis not present

## 2023-12-22 NOTE — Assessment & Plan Note (Signed)
 Encouraged to increase his intake of vitamin D-rich foods such as fatty fish (e.g., salmon, mackerel, and sardines), fortified dairy products, egg yolks, and fortified cereals.

## 2023-12-22 NOTE — Assessment & Plan Note (Signed)
 Controlled Blood Pressure She is currently on amlodipine  5 mg and lisinopril  40 mg. She is asymptomatic at this time during the clinic visit. Encouraged to continue  her medication regimen, along with following a low-sodium diet and increasing physical activity.  The patient verbalized understanding of the plan of care and is aware of the recommendations. BP Readings from Last 3 Encounters:  12/22/23 133/78  08/21/23 (!) 143/86  04/21/23 129/82

## 2023-12-22 NOTE — Assessment & Plan Note (Signed)
 The patient declines pharmacological treatment at this time

## 2023-12-22 NOTE — Patient Instructions (Signed)
I appreciate the opportunity to provide care to you today!    Follow up:  4 months  Labs: please stop by the lab today to get your blood drawn (CBC, CMP, TSH, Lipid profile, HgA1c, Vit D)   Here are some foods to avoid or reduce in your diet to help manage cholesterol levels:  Fried Foods:Deep-fried items such as french fries, fried chicken, and fried snacks are high in unhealthy fats and can raise LDL (bad) cholesterol levels. Processed Meats:Foods like bacon, sausage, hot dogs, and deli meats are often high in saturated fat and cholesterol. Full-Fat Dairy Products:Whole milk, full-fat yogurt, butter, cream, and cheese are rich in saturated fats, which can increase cholesterol levels. Baked Goods and Sweets:Pastries, cakes, cookies, and donuts often contain trans fats and added sugars, which can raise LDL cholesterol and lower HDL (good) cholesterol. Red Meat:Beef, lamb, and pork are high in saturated fat. Lean cuts or plant-based protein alternatives are better options. Lard and Shortening:Used in some baked goods, lard and shortening are high in trans fats and should be avoided. Fast Food:Many fast food items are cooked with unhealthy oils and contain high amounts of saturated and trans fats. Processed Snacks:Chips, crackers, and certain microwave popcorns can contain trans fats and high levels of unhealthy oils. Shellfish:While nutritious in other ways, some shellfish like shrimp, lobster, and crab are high in cholesterol. They should be consumed in moderation. Coconut and Palm Oils:these oils are high in saturated fat and can raise cholesterol levels when used in cooking or baking.       Please continue to a heart-healthy diet and increase your physical activities. Try to exercise for at least five days a week.    It was a pleasure to see you and I look forward to continuing to work together on your health and well-being. Please do not hesitate to call the office if you need  care or have questions about your care.  In case of emergency, please visit the Emergency Department for urgent care, or contact our clinic at [phone] to schedule an appointment. We're here to help you!   Have a wonderful day and week. With Gratitude, Gilmore Laroche MSN, FNP-BC

## 2023-12-22 NOTE — Progress Notes (Signed)
 Established Patient Office Visit  Subjective:  Patient ID: Valerie Parker, female    DOB: [birth year]  Age: 68 y.o. MRN: [id number]  CC: No chief complaint on file.   HPI Valerie Parker is a 68 y.o. female with past medical history of essential hypertension hyperlipidemia and vitamin D  deficiency presents for f/u of  chronic medical conditions.  Medial Right Elbow Pain: The patient presents with pain in the medial aspect of the right elbow joint. She reports that the pain began approximately three weeks ago and first noticed it while leaning on an object at work. The pain typically lasts about 10 seconds and then resolves on its own. She describes the sensation as a dull, achy pain. The discomfort is aggravated by leaning on the elbow but otherwise subsides without intervention. She denies taking any medication for the pain. She also denies any recent injury or trauma, as well as any numbness or tingling in the affected hand.   Past Medical History:  Diagnosis Date   ABDOMINAL BLOATING 11/18/2008   Qualifier: Diagnosis of  By: Cherlyn Cornet MD, [name]     Basal cell carcinoma    on face   Hyperlipidemia    Hypertension    Lichen sclerosus et atrophicus 05/27/2020   Low back pain     Past Surgical History:  Procedure Laterality Date   CESAREAN SECTION     3 times   COLONOSCOPY  2010   repeat 3 years - polyps   TONSILLECTOMY      Family History  Problem Relation Age of Onset   Cancer Mother 73       alcoholism; throat cancer   Diabetes Mother    Alcohol abuse Mother    COPD Father    Alcohol abuse Father    Heart disease Father    Hypertension Father    Diabetes Sister    Hepatitis C Brother    Hepatitis C Sister    Hepatitis C Sister    Mental illness Daughter    Colon cancer Neg Hx     Social History   Socioeconomic History   Marital status: Widowed    Spouse name: Not on file   Number of children: 3   Years of education: Not on file   Highest education level: Some  college, no degree  Occupational History   Occupation: CNA  Tobacco Use   Smoking status: Never   Smokeless tobacco: Never  Vaping Use   Vaping status: Never Used  Substance and Sexual Activity   Alcohol use: No    Alcohol/week: 0.0 standard drinks of alcohol   Drug use: No   Sexual activity: Not Currently    Birth control/protection: Post-menopausal  Other Topics Concern   Not on file  Social History Narrative   Lives alone now-husband passed in 2015- from heart condition    3 children-live close by; 4 granchildren   4 cats and 1 dog   Enjoys: out and about, grandchildren      Diet: eats all food groups   Caffeine: 1 pot of coffee daily   Water: 1-2 cups daily       Wears seat belt   Does not use phone while driving    Smoke detectors at home.   Social Drivers of Corporate investment banker Strain: Low Risk  (06/07/2023)   Overall Financial Resource Strain (CARDIA)    Difficulty of Paying Living Expenses: Not hard at all  Food Insecurity: No Food Insecurity (06/07/2023)  Hunger Vital Sign    Worried About Running Out of Food in the Last Year: Never true    Ran Out of Food in the Last Year: Never true  Transportation Needs: No Transportation Needs (06/07/2023)   PRAPARE - Administrator, Civil Service (Medical): No    Lack of Transportation (Non-Medical): No  Physical Activity: Insufficiently Active (06/07/2023)   Exercise Vital Sign    Days of Exercise per Week: 4 days    Minutes of Exercise per Session: 20 min  Stress: No Stress Concern Present (06/07/2023)   [employer] of Occupational Health - Occupational Stress Questionnaire    Feeling of Stress : Not at all  Social Connections: Moderately Isolated (06/07/2023)   Social Connection and Isolation Panel [NHANES]    Frequency of Communication with Friends and Family: More than three times a week    Frequency of Social Gatherings with Friends and Family: Twice a week    Attends Religious  Services: More than 4 times per year    Active Member of [employer] or Organizations: No    Attends Banker Meetings: Never    Marital Status: Widowed  Intimate Partner Violence: Not At Risk (06/07/2023)   Humiliation, Afraid, Rape, and Kick questionnaire    Fear of Current or Ex-Partner: No    Emotionally Abused: No    Physically Abused: No    Sexually Abused: No    Outpatient Medications Prior to Visit  Medication Sig Dispense Refill   amLODipine  (NORVASC ) 5 MG tablet Take 1 tablet (5 mg total) by mouth daily. 90 tablet 1   Blood Pressure Monitoring (BLOOD PRESSURE CUFF) MISC 1 Act by Does not apply route daily. 1 each 0   ezetimibe  (ZETIA ) 10 MG tablet Take 10 mg by mouth daily.     lisinopril  (ZESTRIL ) 40 MG tablet Take 1 tablet (40 mg total) by mouth daily. 90 tablet 1   Vitamin D , Ergocalciferol , (DRISDOL ) 1.25 MG (50000 UNIT) CAPS capsule Take 1 capsule (50,000 Units total) by mouth every 7 (seven) days. (Patient not taking: Reported on 12/22/2023) 10 capsule 1   No facility-administered medications prior to visit.    No Known Allergies  ROS Review of Systems  Constitutional:  Negative for chills and fever.  Eyes:  Negative for visual disturbance.  Respiratory:  Negative for chest tightness and shortness of breath.   Musculoskeletal:        Elbow joint pain  Neurological:  Negative for dizziness and headaches.      Objective:    Physical Exam HENT:     Head: Normocephalic.     Mouth/Throat:     Mouth: Mucous membranes are moist.  Cardiovascular:     Rate and Rhythm: Normal rate.     Heart sounds: Normal heart sounds.  Pulmonary:     Effort: Pulmonary effort is normal.     Breath sounds: Normal breath sounds.  Musculoskeletal:     Right elbow: No swelling, deformity, effusion or lacerations. Tenderness (with palpation) present in medial epicondyle.  Neurological:     Mental Status: She is alert.     BP 133/78   Pulse 82   Ht 5\' 3"  (1.6 m)   Wt  129 lb 1.3 oz (58.6 kg)   SpO2 96%   BMI 22.87 kg/m  Wt Readings from Last 3 Encounters:  12/22/23 129 lb 1.3 oz (58.6 kg)  08/21/23 129 lb 1.3 oz (58.6 kg)  06/07/23 126 lb (57.2 kg)  Lab Results  Component Value Date   TSH 0.522 08/21/2023   Lab Results  Component Value Date   WBC 6.8 08/21/2023   HGB 13.6 08/21/2023   HCT 40.5 08/21/2023   MCV 92 08/21/2023   PLT 222 08/21/2023   Lab Results  Component Value Date   NA 143 08/21/2023   K 5.0 08/21/2023   CO2 26 08/21/2023   GLUCOSE 96 08/21/2023   BUN 13 08/21/2023   CREATININE 0.68 08/21/2023   BILITOT 0.4 08/21/2023   ALKPHOS 83 08/21/2023   AST 13 08/21/2023   ALT 9 08/21/2023   PROT 6.6 08/21/2023   ALBUMIN 4.2 08/21/2023   CALCIUM  9.5 08/21/2023   ANIONGAP 7 01/20/2023   EGFR 95 08/21/2023   Lab Results  Component Value Date   CHOL 178 08/21/2023   Lab Results  Component Value Date   HDL 61 08/21/2023   Lab Results  Component Value Date   LDLCALC 105 (H) 08/21/2023   Lab Results  Component Value Date   TRIG 61 08/21/2023   Lab Results  Component Value Date   CHOLHDL 2.9 08/21/2023   Lab Results  Component Value Date   HGBA1C 5.7 (H) 08/21/2023      Assessment & Plan:  Essential hypertension Assessment & Plan: Controlled Blood Pressure She is currently on amlodipine  5 mg and lisinopril  40 mg. She is asymptomatic at this time during the clinic visit. Encouraged to continue  her medication regimen, along with following a low-sodium diet and increasing physical activity.  The patient verbalized understanding of the plan of care and is aware of the recommendations. BP Readings from Last 3 Encounters:  12/22/23 133/78  08/21/23 (!) 143/86  04/21/23 129/82          HYPERCHOLESTEROLEMIA Assessment & Plan: I encouraged the patient to continue taking ezetimibe  10 mg daily and recommended lifestyle changes, including decreasing the intake of greasy, fatty foods and increasing  physical activity.   Orders: -     Lipid panel -     CMP14+EGFR -     CBC with Differential/Platelet  Right elbow pain Assessment & Plan: I encouraged nonpharmacological interventions, such as alternating heat and cold therapy and avoiding aggravating or repetitive activities. The patient was advised to follow up if symptoms affect their activities of daily living (ADLs), if pain levels increase, or if they experience numbness and tingling. The patient verbalized understanding and is aware of the plan of care.    Vitamin D  deficiency Assessment & Plan: Encouraged to increase his intake of vitamin D -rich foods such as fatty fish (e.g., salmon, mackerel, and sardines), fortified dairy products, egg yolks, and fortified cereals.   Orders: -     VITAMIN D  25 Hydroxy (Vit-D Deficiency, Fractures)  IFG (impaired fasting glucose) -     Hemoglobin A1c  TSH (thyroid -stimulating hormone deficiency) -     TSH + free T4  Note: This chart has been completed using Engineer, civil (consulting) software, and while attempts have been made to ensure accuracy, certain words and phrases may not be transcribed as intended.    Follow-up: Return in about 4 months (around 04/23/2024).   Shelle Galdamez, FNP

## 2023-12-22 NOTE — Assessment & Plan Note (Signed)
 I encouraged the patient to continue taking ezetimibe  10 mg daily and recommended lifestyle changes, including decreasing the intake of greasy, fatty foods and increasing physical activity.

## 2023-12-22 NOTE — Assessment & Plan Note (Signed)
 I encouraged nonpharmacological interventions, such as alternating heat and cold therapy and avoiding aggravating or repetitive activities. The patient was advised to follow up if symptoms affect their activities of daily living (ADLs), if pain levels increase, or if they experience numbness and tingling. The patient verbalized understanding and is aware of the plan of care.

## 2023-12-23 ENCOUNTER — Other Ambulatory Visit: Payer: Self-pay | Admitting: Family Medicine

## 2023-12-23 DIAGNOSIS — E559 Vitamin D deficiency, unspecified: Secondary | ICD-10-CM

## 2023-12-23 LAB — CMP14+EGFR
ALT: 9 IU/L (ref 0–32)
AST: 13 IU/L (ref 0–40)
Albumin: 4.1 g/dL (ref 3.9–4.9)
Alkaline Phosphatase: 74 IU/L (ref 44–121)
BUN/Creatinine Ratio: 24 (ref 12–28)
BUN: 17 mg/dL (ref 8–27)
Bilirubin Total: 0.6 mg/dL (ref 0.0–1.2)
CO2: 24 mmol/L (ref 20–29)
Calcium: 9.3 mg/dL (ref 8.7–10.3)
Chloride: 107 mmol/L — ABNORMAL HIGH (ref 96–106)
Creatinine, Ser: 0.7 mg/dL (ref 0.57–1.00)
Globulin, Total: 2.3 g/dL (ref 1.5–4.5)
Glucose: 90 mg/dL (ref 70–99)
Potassium: 4.1 mmol/L (ref 3.5–5.2)
Sodium: 143 mmol/L (ref 134–144)
Total Protein: 6.4 g/dL (ref 6.0–8.5)
eGFR: 94 mL/min/{1.73_m2} (ref >59)

## 2023-12-23 LAB — CBC WITH DIFFERENTIAL/PLATELET
Basophils Absolute: 0 10*3/uL (ref 0.0–0.2)
Basos: 1 %
EOS (ABSOLUTE): 0.1 10*3/uL (ref 0.0–0.4)
Eos: 2 %
Hematocrit: 38.5 % (ref 34.0–46.6)
Hemoglobin: 12.9 g/dL (ref 11.1–15.9)
Immature Grans (Abs): 0 10*3/uL (ref 0.0–0.1)
Immature Granulocytes: 0 %
Lymphocytes Absolute: 1.3 10*3/uL (ref 0.7–3.1)
Lymphs: 31 %
MCH: 30.4 pg (ref 26.6–33.0)
MCHC: 33.5 g/dL (ref 31.5–35.7)
MCV: 91 fL (ref 79–97)
Monocytes Absolute: 0.3 10*3/uL (ref 0.1–0.9)
Monocytes: 8 %
Neutrophils Absolute: 2.6 10*3/uL (ref 1.4–7.0)
Neutrophils: 58 %
Platelets: 227 10*3/uL (ref 150–450)
RBC: 4.24 x10E6/uL (ref 3.77–5.28)
RDW: 12.4 % (ref 11.7–15.4)
WBC: 4.4 10*3/uL (ref 3.4–10.8)

## 2023-12-23 LAB — LIPID PANEL
Chol/HDL Ratio: 3 ratio (ref 0.0–4.4)
Cholesterol, Total: 156 mg/dL (ref 100–199)
HDL: 52 mg/dL (ref >39)
LDL Chol Calc (NIH): 95 mg/dL (ref 0–99)
Triglycerides: 40 mg/dL (ref 0–149)
VLDL Cholesterol Cal: 9 mg/dL (ref 5–40)

## 2023-12-23 LAB — TSH+FREE T4
Free T4: 1.19 ng/dL (ref 0.82–1.77)
TSH: 0.744 u[IU]/mL (ref 0.450–4.500)

## 2023-12-23 LAB — HEMOGLOBIN A1C
Est. average glucose Bld gHb Est-mCnc: 105 mg/dL
Hgb A1c MFr Bld: 5.3 % (ref 4.8–5.6)

## 2023-12-23 LAB — VITAMIN D 25 HYDROXY (VIT D DEFICIENCY, FRACTURES): Vit D, 25-Hydroxy: 25.3 ng/mL — ABNORMAL LOW (ref 30.0–100.0)

## 2023-12-23 MED ORDER — VITAMIN D (ERGOCALCIFEROL) 1.25 MG (50000 UNIT) PO CAPS
50000.0000 [IU] | ORAL_CAPSULE | ORAL | 1 refills | Status: DC
Start: 2023-12-23 — End: 2024-06-10

## 2023-12-23 NOTE — Progress Notes (Signed)
 Please inform the patient that a refill of her weekly vitamin D  supplement has been sent to her pharmacy to resume therapy, as her vitamin D  levels are slightly low. Great effort on decreasing her hemoglobin A1c to a normal range of 5.3-she is no longer in the prediabetic range. I recommend that she continue following a heart-healthy diet and increase her physical activity. All other lab results are stable.

## 2024-01-24 DIAGNOSIS — H4311 Vitreous hemorrhage, right eye: Secondary | ICD-10-CM | POA: Diagnosis not present

## 2024-01-24 DIAGNOSIS — H25813 Combined forms of age-related cataract, bilateral: Secondary | ICD-10-CM | POA: Diagnosis not present

## 2024-01-24 DIAGNOSIS — H33311 Horseshoe tear of retina without detachment, right eye: Secondary | ICD-10-CM | POA: Diagnosis not present

## 2024-04-18 ENCOUNTER — Ambulatory Visit (INDEPENDENT_AMBULATORY_CARE_PROVIDER_SITE_OTHER): Admitting: Family Medicine

## 2024-04-18 ENCOUNTER — Encounter: Payer: Self-pay | Admitting: Family Medicine

## 2024-04-18 VITALS — BP 138/81 | HR 7 | Ht 63.0 in | Wt 130.0 lb

## 2024-04-18 DIAGNOSIS — E559 Vitamin D deficiency, unspecified: Secondary | ICD-10-CM

## 2024-04-18 DIAGNOSIS — E7800 Pure hypercholesterolemia, unspecified: Secondary | ICD-10-CM | POA: Diagnosis not present

## 2024-04-18 DIAGNOSIS — E038 Other specified hypothyroidism: Secondary | ICD-10-CM | POA: Diagnosis not present

## 2024-04-18 DIAGNOSIS — E7849 Other hyperlipidemia: Secondary | ICD-10-CM | POA: Diagnosis not present

## 2024-04-18 DIAGNOSIS — I10 Essential (primary) hypertension: Secondary | ICD-10-CM

## 2024-04-18 DIAGNOSIS — R7301 Impaired fasting glucose: Secondary | ICD-10-CM | POA: Diagnosis not present

## 2024-04-18 NOTE — Assessment & Plan Note (Signed)
 Encouraged to increase her intake of vitamin D-rich foods such as fatty fish (e.g., salmon, mackerel, and sardines), fortified dairy products, egg yolks, and fortified cereals.

## 2024-04-18 NOTE — Patient Instructions (Addendum)
 I appreciate the opportunity to provide care to you today!    Follow up:  5 months  Labs: please stop by the lab today to get your blood drawn (CBC, CMP, TSH, Lipid profile, HgA1c, Vit D)   Schedule medicare annual wellness visit  For a Healthier YOU, I Recommend: Reducing your intake of sugar, sodium, carbohydrates, and saturated fats. Increasing your fiber intake by incorporating more whole grains, fruits, and vegetables into your meals. Setting healthy goals with a focus on lowering your consumption of carbs, sugar, and unhealthy fats. Adding variety to your diet by including a wide range of fruits and vegetables. Cutting back on soda and limiting processed foods as much as possible. Staying active: In addition to taking your weight loss medication, aim for at least 150 minutes of moderate-intensity physical activity each week for optimal results.   Please follow up if your symptoms worsen or fail to improve.    Please continue to a heart-healthy diet and increase your physical activities. Try to exercise for at least five days a week.    It was a pleasure to see you and I look forward to continuing to work together on your health and well-being. Please do not hesitate to call the office if you need care or have questions about your care.  In case of emergency, please visit the Emergency Department for urgent care, or contact our clinic at [phone] to schedule an appointment. We're here to help you!   Have a wonderful day and week. With Gratitude, Beauden Tremont MSN, FNP-BC

## 2024-04-18 NOTE — Progress Notes (Deleted)
   Complete physical exam  Patient: Valerie Parker   DOB: 07-30-1956   68 y.o. Female  MRN: [id number]  Subjective:    Chief Complaint  Patient presents with   Medical Management of Chronic Issues    Valerie Parker is a 68 y.o. female who presents today for a complete physical exam. She reports consuming a {diet types:17450} diet. {types:19826} She generally feels {DESC; WELL/FAIRLY WELL/POORLY:18703}. She reports sleeping {DESC; WELL/FAIRLY WELL/POORLY:18703}. She {does/does not:200015} have additional problems to discuss today.    Most recent fall risk assessment:    04/18/2024    8:59 AM  Fall Risk   Falls in the past year? 0     Most recent depression screenings:    04/18/2024    8:59 AM 12/22/2023   10:24 AM  PHQ 2/9 Scores  PHQ - 2 Score 0 0  PHQ- 9 Score 0 0    {VISON DENTAL STD PSA (Optional):27386}  {History (Optional):23778}  Patient Care Team: Zoie Sarin, FNP as PCP - General (Family Medicine) Arthur [place] Ueberroth as Consulting Physician (Ophthalmology)   Outpatient Medications Prior to Visit  Medication Sig   amLODipine  (NORVASC ) 5 MG tablet Take 1 tablet (5 mg total) by mouth daily.   Blood Pressure Monitoring (BLOOD PRESSURE CUFF) MISC 1 Act by Does not apply route daily.   ezetimibe  (ZETIA ) 10 MG tablet Take 10 mg by mouth daily.   lisinopril  (ZESTRIL ) 40 MG tablet Take 1 tablet (40 mg total) by mouth daily.   Vitamin D , Ergocalciferol , (DRISDOL ) 1.25 MG (50000 UNIT) CAPS capsule Take 1 capsule (50,000 Units total) by mouth every 7 (seven) days.   No facility-administered medications prior to visit.    ROS     Objective:    BP 138/81   Pulse (!) 7   Ht 5' 3 (1.6 m)   Wt 130 lb (59 kg)   SpO2 95%   BMI 23.03 kg/m  {Vitals History (Optional):23777}  Physical Exam  No results found for any visits on 04/18/24. {Show previous labs (optional):23779}    Assessment & Plan:    Routine Health Maintenance and Physical Exam  Immunization  History  Administered Date(s) Administered   Influenza Whole 05/22/2008   Influenza,trivalent, recombinat, inj, PF 06/20/2022   Influenza-Unspecified 05/14/2018, 05/29/2023   PNEUMOCOCCAL CONJUGATE-20 10/13/2022   Td 08/23/2007   Tdap 05/11/2021   Zoster Recombinant(Shingrix) 04/19/2023   Zoster, Unspecified 05/29/2023    Health Maintenance  Topic Date Due   COVID-19 Vaccine (1 - 2024-25 season) Never done   MAMMOGRAM  10/24/2023   INFLUENZA VACCINE  03/22/2024   Medicare Annual Wellness (AWV)  06/06/2024   DEXA SCAN  10/23/2024   Colonoscopy  09/08/2025   DTaP/Tdap/Td (3 - Td or Tdap) 05/12/2031   Pneumococcal Vaccine: 50+ Years  Completed   Hepatitis C Screening  Completed   Zoster Vaccines- Shingrix  Completed   HPV VACCINES  Aged Out   Meningococcal B Vaccine  Aged Out    Discussed health benefits of physical activity, and encouraged her to engage in regular exercise appropriate for her age and condition.  There are no diagnoses linked to this encounter.  No follow-ups on file.     Valerie Goracke, FNP

## 2024-04-18 NOTE — Progress Notes (Signed)
 Established Patient Office Visit  Subjective:  Patient ID: Valerie Parker, female    DOB: 1955/10/05  Age: 68 y.o. MRN: [id number]  CC:  Chief Complaint  Patient presents with   Medical Management of Chronic Issues    HPI Valerie Parker is a 68 y.o. female with past medical history of essential hypertension, vitamin D  deficiency, hypercholesterolemia presents for f/u of  chronic medical conditions.  For the details of today's visit, please refer to the assessment and plan.     Past Medical History:  Diagnosis Date   ABDOMINAL BLOATING 11/18/2008   Qualifier: Diagnosis of  By: Avelina MD, [name]     Basal cell carcinoma    on face   Hyperlipidemia    Hypertension    Lichen sclerosus et atrophicus 05/27/2020   Low back pain     Past Surgical History:  Procedure Laterality Date   CESAREAN SECTION     3 times   COLONOSCOPY  2010   repeat 3 years - polyps   TONSILLECTOMY      Family History  Problem Relation Age of Onset   Cancer Mother 63       alcoholism; throat cancer   Diabetes Mother    Alcohol abuse Mother    COPD Father    Alcohol abuse Father    Heart disease Father    Hypertension Father    Diabetes Sister    Hepatitis C Brother    Hepatitis C Sister    Hepatitis C Sister    Mental illness Daughter    Colon cancer Neg Hx     Social History   Socioeconomic History   Marital status: Widowed    Spouse name: Not on file   Number of children: 3   Years of education: Not on file   Highest education level: Some college, no degree  Occupational History   Occupation: CNA  Tobacco Use   Smoking status: Never   Smokeless tobacco: Never  Vaping Use   Vaping status: Never Used  Substance and Sexual Activity   Alcohol use: No    Alcohol/week: 0.0 standard drinks of alcohol   Drug use: No   Sexual activity: Not Currently    Birth control/protection: Post-menopausal  Other Topics Concern   Not on file  Social History Narrative   Lives alone now-husband  passed in 2015- from heart condition    3 children-live close by; 4 granchildren   4 cats and 1 dog   Enjoys: out and about, grandchildren      Diet: eats all food groups   Caffeine: 1 pot of coffee daily   Water: 1-2 cups daily       Wears seat belt   Does not use phone while driving    Smoke detectors at home.   Social Drivers of Corporate investment banker Strain: Low Risk  (06/07/2023)   Overall Financial Resource Strain (CARDIA)    Difficulty of Paying Living Expenses: Not hard at all  Food Insecurity: No Food Insecurity (06/07/2023)   Hunger Vital Sign    Worried About Running Out of Food in the Last Year: Never true    Ran Out of Food in the Last Year: Never true  Transportation Needs: No Transportation Needs (06/07/2023)   PRAPARE - Administrator, Civil Service (Medical): No    Lack of Transportation (Non-Medical): No  Physical Activity: Insufficiently Active (06/07/2023)   Exercise Vital Sign    Days of Exercise  per Week: 4 days    Minutes of Exercise per Session: 20 min  Stress: No Stress Concern Present (06/07/2023)   [employer] of Occupational Health - Occupational Stress Questionnaire    Feeling of Stress : Not at all  Social Connections: Moderately Isolated (06/07/2023)   Social Connection and Isolation Panel    Frequency of Communication with Friends and Family: More than three times a week    Frequency of Social Gatherings with Friends and Family: Twice a week    Attends Religious Services: More than 4 times per year    Active Member of [employer] or Organizations: No    Attends Banker Meetings: Never    Marital Status: Widowed  Intimate Partner Violence: Not At Risk (06/07/2023)   Humiliation, Afraid, Rape, and Kick questionnaire    Fear of Current or Ex-Partner: No    Emotionally Abused: No    Physically Abused: No    Sexually Abused: No    Outpatient Medications Prior to Visit  Medication Sig Dispense Refill    amLODipine  (NORVASC ) 5 MG tablet Take 1 tablet (5 mg total) by mouth daily. 90 tablet 1   Blood Pressure Monitoring (BLOOD PRESSURE CUFF) MISC 1 Act by Does not apply route daily. 1 each 0   ezetimibe  (ZETIA ) 10 MG tablet Take 10 mg by mouth daily.     lisinopril  (ZESTRIL ) 40 MG tablet Take 1 tablet (40 mg total) by mouth daily. 90 tablet 1   Vitamin D , Ergocalciferol , (DRISDOL ) 1.25 MG (50000 UNIT) CAPS capsule Take 1 capsule (50,000 Units total) by mouth every 7 (seven) days. 27 capsule 1   No facility-administered medications prior to visit.    No Known Allergies  ROS Review of Systems  Constitutional:  Negative for chills and fever.  Eyes:  Negative for visual disturbance.  Respiratory:  Negative for chest tightness and shortness of breath.   Neurological:  Negative for dizziness and headaches.      Objective:    Physical Exam HENT:     Head: Normocephalic.     Mouth/Throat:     Mouth: Mucous membranes are moist.  Cardiovascular:     Rate and Rhythm: Normal rate.     Heart sounds: Normal heart sounds.  Pulmonary:     Effort: Pulmonary effort is normal.     Breath sounds: Normal breath sounds.  Neurological:     Mental Status: She is alert.     BP 138/81   Pulse (!) 7   Ht 5' 3 (1.6 m)   Wt 130 lb (59 kg)   SpO2 95%   BMI 23.03 kg/m  Wt Readings from Last 3 Encounters:  04/18/24 130 lb (59 kg)  12/22/23 129 lb 1.3 oz (58.6 kg)  08/21/23 129 lb 1.3 oz (58.6 kg)    Lab Results  Component Value Date   TSH 0.744 12/22/2023   Lab Results  Component Value Date   WBC 4.4 12/22/2023   HGB 12.9 12/22/2023   HCT 38.5 12/22/2023   MCV 91 12/22/2023   PLT 227 12/22/2023   Lab Results  Component Value Date   NA 143 12/22/2023   K 4.1 12/22/2023   CO2 24 12/22/2023   GLUCOSE 90 12/22/2023   BUN 17 12/22/2023   CREATININE 0.70 12/22/2023   BILITOT 0.6 12/22/2023   ALKPHOS 74 12/22/2023   AST 13 12/22/2023   ALT 9 12/22/2023   PROT 6.4 12/22/2023    ALBUMIN 4.1 12/22/2023   CALCIUM  9.3 12/22/2023  ANIONGAP 7 01/20/2023   EGFR 94 12/22/2023   Lab Results  Component Value Date   CHOL 156 12/22/2023   Lab Results  Component Value Date   HDL 52 12/22/2023   Lab Results  Component Value Date   LDLCALC 95 12/22/2023   Lab Results  Component Value Date   TRIG 40 12/22/2023   Lab Results  Component Value Date   CHOLHDL 3.0 12/22/2023   Lab Results  Component Value Date   HGBA1C 5.3 12/22/2023      Assessment & Plan:  Essential hypertension Assessment & Plan: Controlled Blood Pressure She is currently on amlodipine  5 mg and lisinopril  40 mg. She is asymptomatic at this time during the clinic visit. Encouraged to continue  her medication regimen, along with following a low-sodium diet and increasing physical activity.  The patient verbalized understanding of the plan of care and is aware of the recommendations. BP Readings from Last 3 Encounters:  04/18/24 138/81  12/22/23 133/78  08/21/23 (!) 143/86          HYPERCHOLESTEROLEMIA Assessment & Plan: I encouraged the patient to continue taking ezetimibe  10 mg daily and recommended lifestyle changes, including decreasing the intake of greasy, fatty foods and increasing physical activity.    Vitamin D  deficiency Assessment & Plan: Encouraged to increase her intake of vitamin D -rich foods such as fatty fish (e.g., salmon, mackerel, and sardines), fortified dairy products, egg yolks, and fortified cereals.   Orders: -     VITAMIN D  25 Hydroxy (Vit-D Deficiency, Fractures)  Other hyperlipidemia -     Lipid panel -     CMP14+EGFR -     CBC with Differential/Platelet  IFG (impaired fasting glucose) -     Hemoglobin A1c  TSH (thyroid -stimulating hormone deficiency) -     TSH + free T4  Note: This chart has been completed using Engineer, civil (consulting) software, and while attempts have been made to ensure accuracy, certain words and phrases may not be  transcribed as intended.    Follow-up: Return in about 5 months (around 09/18/2024).   Beva Remund, FNP

## 2024-04-18 NOTE — Assessment & Plan Note (Signed)
 Controlled Blood Pressure She is currently on amlodipine  5 mg and lisinopril  40 mg. She is asymptomatic at this time during the clinic visit. Encouraged to continue  her medication regimen, along with following a low-sodium diet and increasing physical activity.  The patient verbalized understanding of the plan of care and is aware of the recommendations. BP Readings from Last 3 Encounters:  04/18/24 138/81  12/22/23 133/78  08/21/23 (!) 143/86

## 2024-04-18 NOTE — Assessment & Plan Note (Signed)
 I encouraged the patient to continue taking ezetimibe  10 mg daily and recommended lifestyle changes, including decreasing the intake of greasy, fatty foods and increasing physical activity.

## 2024-04-19 ENCOUNTER — Other Ambulatory Visit: Payer: Self-pay | Admitting: Family Medicine

## 2024-04-19 ENCOUNTER — Telehealth: Payer: Self-pay

## 2024-04-19 ENCOUNTER — Ambulatory Visit: Payer: Self-pay | Admitting: Family Medicine

## 2024-04-19 ENCOUNTER — Telehealth: Payer: Self-pay | Admitting: Family Medicine

## 2024-04-19 DIAGNOSIS — E875 Hyperkalemia: Secondary | ICD-10-CM

## 2024-04-19 DIAGNOSIS — E038 Other specified hypothyroidism: Secondary | ICD-10-CM

## 2024-04-19 LAB — LIPID PANEL
Chol/HDL Ratio: 3.3 ratio (ref 0.0–4.4)
Cholesterol, Total: 167 mg/dL (ref 100–199)
HDL: 50 mg/dL (ref >39)
LDL Chol Calc (NIH): 105 mg/dL — ABNORMAL HIGH (ref 0–99)
Triglycerides: 62 mg/dL (ref 0–149)
VLDL Cholesterol Cal: 12 mg/dL (ref 5–40)

## 2024-04-19 LAB — CBC WITH DIFFERENTIAL/PLATELET
Basophils Absolute: 0 x10E3/uL (ref 0.0–0.2)
Basos: 1 %
EOS (ABSOLUTE): 0.1 x10E3/uL (ref 0.0–0.4)
Eos: 2 %
Hematocrit: 39.8 % (ref 34.0–46.6)
Hemoglobin: 13.2 g/dL (ref 11.1–15.9)
Immature Grans (Abs): 0 x10E3/uL (ref 0.0–0.1)
Immature Granulocytes: 0 %
Lymphocytes Absolute: 1.4 x10E3/uL (ref 0.7–3.1)
Lymphs: 32 %
MCH: 30.9 pg (ref 26.6–33.0)
MCHC: 33.2 g/dL (ref 31.5–35.7)
MCV: 93 fL (ref 79–97)
Monocytes Absolute: 0.5 x10E3/uL (ref 0.1–0.9)
Monocytes: 10 %
Neutrophils Absolute: 2.5 x10E3/uL (ref 1.4–7.0)
Neutrophils: 55 %
Platelets: 225 x10E3/uL (ref 150–450)
RBC: 4.27 x10E6/uL (ref 3.77–5.28)
RDW: 12.3 % (ref 11.7–15.4)
WBC: 4.5 x10E3/uL (ref 3.4–10.8)

## 2024-04-19 LAB — CMP14+EGFR
ALT: 10 IU/L (ref 0–32)
AST: 11 IU/L (ref 0–40)
Albumin: 4.1 g/dL (ref 3.9–4.9)
Alkaline Phosphatase: 74 IU/L (ref 44–121)
BUN/Creatinine Ratio: 23 (ref 12–28)
BUN: 16 mg/dL (ref 8–27)
Bilirubin Total: 0.5 mg/dL (ref 0.0–1.2)
CO2: 26 mmol/L (ref 20–29)
Calcium: 9.6 mg/dL (ref 8.7–10.3)
Chloride: 106 mmol/L (ref 96–106)
Creatinine, Ser: 0.69 mg/dL (ref 0.57–1.00)
Globulin, Total: 2.4 g/dL (ref 1.5–4.5)
Glucose: 92 mg/dL (ref 70–99)
Potassium: 5.5 mmol/L — ABNORMAL HIGH (ref 3.5–5.2)
Sodium: 143 mmol/L (ref 134–144)
Total Protein: 6.5 g/dL (ref 6.0–8.5)
eGFR: 94 mL/min/1.73 (ref >59)

## 2024-04-19 LAB — TSH+FREE T4
Free T4: 1.17 ng/dL (ref 0.82–1.77)
TSH: 0.39 u[IU]/mL — ABNORMAL LOW (ref 0.450–4.500)

## 2024-04-19 LAB — HEMOGLOBIN A1C
Est. average glucose Bld gHb Est-mCnc: 108 mg/dL
Hgb A1c MFr Bld: 5.4 % (ref 4.8–5.6)

## 2024-04-19 LAB — VITAMIN D 25 HYDROXY (VIT D DEFICIENCY, FRACTURES): Vit D, 25-Hydroxy: 30.5 ng/mL (ref 30.0–100.0)

## 2024-04-19 NOTE — Telephone Encounter (Signed)
 Copied from CRM [id number]. Topic: Clinical - Medical Advice >> Apr 19, 2024  1:39 PM Nathanel BROCKS wrote: Reason for CRM: pt is returning call about elevated potassium level and what she needs to do. She called earlier but she hasn't heard from anyone. Calling the office to see if I can get a nurse to speak with her and sending message as well.

## 2024-04-19 NOTE — Telephone Encounter (Signed)
 Copied from CRM [id number]. Topic: Clinical - Lab/Test Results >> Apr 19, 2024 12:21 PM Kevelyn M wrote: Reason for CRM: Patient calling because her potassium is high and she's asking if she needs to do anything. Call back # [phone] >> Apr 19, 2024 12:53 PM Travis F wrote: Patient is calling in again to see what her provider wanted to do about her potassium. Patient said she is traveling and wanted to know if her provider wanted to send her some medicine in. She says she is going to pull over and wait a few minutes for a call. Patient is also two hours away. She is requesting as soon as possible.

## 2024-04-19 NOTE — Telephone Encounter (Signed)
 Patients Potassium was HIGH at 5.5. She was asking if she needed to go to the ER or if she just needed the [facility] sent in. She is on vacation now at [facility] and if medication is prescribed, please send it to [facility] [address] in [place]. [phone]

## 2024-04-24 ENCOUNTER — Ambulatory Visit: Admitting: Internal Medicine

## 2024-04-26 ENCOUNTER — Ambulatory Visit: Admitting: Family Medicine

## 2024-05-01 DIAGNOSIS — E875 Hyperkalemia: Secondary | ICD-10-CM | POA: Diagnosis not present

## 2024-05-01 DIAGNOSIS — E038 Other specified hypothyroidism: Secondary | ICD-10-CM | POA: Diagnosis not present

## 2024-05-02 LAB — BMP8+EGFR
BUN/Creatinine Ratio: 18 (ref 12–28)
BUN: 16 mg/dL (ref 8–27)
CO2: 24 mmol/L (ref 20–29)
Calcium: 9.2 mg/dL (ref 8.7–10.3)
Chloride: 105 mmol/L (ref 96–106)
Creatinine, Ser: 0.87 mg/dL (ref 0.57–1.00)
Glucose: 92 mg/dL (ref 70–99)
Potassium: 4.9 mmol/L (ref 3.5–5.2)
Sodium: 143 mmol/L (ref 134–144)
eGFR: 72 mL/min/1.73 (ref >59)

## 2024-05-02 LAB — TSH+FREE T4
Free T4: 1.18 ng/dL (ref 0.82–1.77)
TSH: 0.349 u[IU]/mL — ABNORMAL LOW (ref 0.450–4.500)

## 2024-05-04 ENCOUNTER — Ambulatory Visit: Payer: Self-pay | Admitting: Family Medicine

## 2024-05-27 ENCOUNTER — Other Ambulatory Visit: Payer: Self-pay | Admitting: Family Medicine

## 2024-05-27 ENCOUNTER — Other Ambulatory Visit: Payer: Self-pay

## 2024-05-27 ENCOUNTER — Emergency Department (HOSPITAL_COMMUNITY)
Admission: EM | Admit: 2024-05-27 | Discharge: 2024-05-28 | Disposition: A | Discharge status: Home / Self Care | Attending: Emergency Medicine | Admitting: Emergency Medicine

## 2024-05-27 ENCOUNTER — Emergency Department (HOSPITAL_COMMUNITY)

## 2024-05-27 ENCOUNTER — Encounter (HOSPITAL_COMMUNITY): Payer: Self-pay

## 2024-05-27 DIAGNOSIS — W010XXA Fall on same level from slipping, tripping and stumbling without subsequent striking against object, initial encounter: Secondary | ICD-10-CM | POA: Diagnosis not present

## 2024-05-27 DIAGNOSIS — E7800 Pure hypercholesterolemia, unspecified: Secondary | ICD-10-CM

## 2024-05-27 DIAGNOSIS — S52121A Displaced fracture of head of right radius, initial encounter for closed fracture: Secondary | ICD-10-CM | POA: Diagnosis not present

## 2024-05-27 DIAGNOSIS — S52124A Nondisplaced fracture of head of right radius, initial encounter for closed fracture: Secondary | ICD-10-CM | POA: Insufficient documentation

## 2024-05-27 DIAGNOSIS — Z79899 Other long term (current) drug therapy: Secondary | ICD-10-CM | POA: Diagnosis not present

## 2024-05-27 DIAGNOSIS — I10 Essential (primary) hypertension: Secondary | ICD-10-CM | POA: Diagnosis not present

## 2024-05-27 DIAGNOSIS — M25521 Pain in right elbow: Secondary | ICD-10-CM | POA: Diagnosis present

## 2024-05-27 NOTE — ED Provider Notes (Incomplete)
 [place] EMERGENCY DEPARTMENT AT [facility] Provider Note   CSN: [id number] Arrival date & time: 05/27/24  2155     Patient presents with: Hand Injury   Valerie Parker is a 68 y.o. female.  {Add pertinent medical, surgical, social history, OB history to YEP:67052} The history is provided by the patient.  Hand Injury      Prior to Admission medications   Medication Sig Start Date End Date Taking? Authorizing Provider  amLODipine  (NORVASC ) 5 MG tablet Take 1 tablet (5 mg total) by mouth daily. 08/21/23   Bacchus, Meade PEDLAR, FNP  Blood Pressure Monitoring (BLOOD PRESSURE CUFF) MISC 1 Act by Does not apply route daily. 08/23/22   Bacchus, Meade PEDLAR, FNP  ezetimibe  (ZETIA ) 10 MG tablet Take 1 tablet by mouth once daily 05/27/24   Bacchus, Gloria Z, FNP  lisinopril  (ZESTRIL ) 40 MG tablet Take 1 tablet (40 mg total) by mouth daily. 08/21/23   Bacchus, Meade PEDLAR, FNP  Vitamin D , Ergocalciferol , (DRISDOL ) 1.25 MG (50000 UNIT) CAPS capsule Take 1 capsule (50,000 Units total) by mouth every 7 (seven) days. 12/23/23   Bacchus, Gloria Z, FNP    Allergies: Patient has no known allergies.    Review of Systems  All other systems reviewed and are negative.   Updated Vital Signs BP (!) 142/71 (BP Location: Left Arm)   Pulse 79   Temp 98.1 F (36.7 C) (Oral)   Resp 18   Ht 5' 3 (1.6 m)   Wt 59 kg   SpO2 100%   BMI 23.04 kg/m   Physical Exam Vitals and nursing note reviewed.   68 year old female, resting comfortably and in no acute distress. Vital signs are ***. Oxygen saturation is ***%, which is normal. Head is normocephalic and atraumatic. PERRLA, EOMI. Oropharynx is clear. Neck is nontender and supple without adenopathy or JVD. Back is nontender and there is no CVA tenderness. Lungs are clear without rales, wheezes, or rhonchi. Chest is nontender. Heart has regular rate and rhythm without murmur. Abdomen is soft, flat, nontender without masses or hepatosplenomegaly and  peristalsis is normoactive. Extremities have no cyanosis or edema, full range of motion is present. Skin is warm and dry without rash. Neurologic: Mental status is normal, cranial nerves are intact, there are no motor or sensory deficits.  (all labs ordered are listed, but only abnormal results are displayed) Labs Reviewed - No data to display  EKG: None  Radiology: DG Forearm Right Result Date: 05/27/2024 EXAM: 3 VIEW(S) XRAY OF THE RIGHT FOREARM COMPARISON: None available. CLINICAL HISTORY: fall. Pt to ED from home with c/o right arm pain, pt trying to catch cat and fell, injuring right forearm/elbow area. No LOC, pt amublatory; Best images obtainable pt unable to turn arm. Hx fracture in R arm abt 10 yrs ago FINDINGS: BONES AND JOINTS: Comminuted radial head and coronoid process fractures. No dislocation. SOFT TISSUES: Elbow effusion. IMPRESSION: 1. Comminuted radial head and coronoid process fractures. No dislocation. 2. Elbow effusion. Electronically signed by: Dorethia Molt MD 05/27/2024 10:37 PM EDT RP Workstation: [id number]    {Document cardiac monitor, telemetry assessment procedure when appropriate:32947} Procedures   Medications Ordered in the ED - No data to display    {Click here for ABCD2, HEART and other calculators REFRESH Note before signing:1}                              Medical Decision Making  Amount and/or Complexity of Data Reviewed Radiology: ordered.   ***  {Document critical care time when appropriate  Document review of labs and clinical decision tools ie CHADS2VASC2, etc  Document your independent review of radiology images and any outside records  Document your discussion with family members, caretakers and with consultants  Document social determinants of health affecting pt's care  Document your decision making why or why not admission, treatments were needed:32947:::1}   Final diagnoses:  None    ED Discharge Orders     None

## 2024-05-27 NOTE — ED Provider Notes (Signed)
 [place] EMERGENCY DEPARTMENT AT [facility] Provider Note   CSN: [id number] Arrival date & time: 05/27/24  2155     Patient presents with: Hand Injury   Valerie Parker is a 68 y.o. female.   The history is provided by the patient.  Hand Injury  She has history of hypertension, hyperlipidemia and comes in after injuring her right arm in a fall.  She was chasing after her cat and does not know if she slipped or tripped but as she fell, she put out her right arm to try to break her fall.  She is complaining of pain from the right elbow all the way down to the right wrist.  She denies other injury.    Prior to Admission medications   Medication Sig Start Date End Date Taking? Authorizing Provider  amLODipine  (NORVASC ) 5 MG tablet Take 1 tablet (5 mg total) by mouth daily. 08/21/23   Bacchus, Meade PEDLAR, FNP  Blood Pressure Monitoring (BLOOD PRESSURE CUFF) MISC 1 Act by Does not apply route daily. 08/23/22   Bacchus, Meade PEDLAR, FNP  ezetimibe  (ZETIA ) 10 MG tablet Take 1 tablet by mouth once daily 05/27/24   Bacchus, Gloria Z, FNP  lisinopril  (ZESTRIL ) 40 MG tablet Take 1 tablet (40 mg total) by mouth daily. 08/21/23   Bacchus, Gloria Z, FNP  Vitamin D , Ergocalciferol , (DRISDOL ) 1.25 MG (50000 UNIT) CAPS capsule Take 1 capsule (50,000 Units total) by mouth every 7 (seven) days. 12/23/23   Bacchus, Gloria Z, FNP    Allergies: Patient has no known allergies.    Review of Systems  All other systems reviewed and are negative.   Updated Vital Signs BP (!) 142/71 (BP Location: Left Arm)   Pulse 79   Temp 98.1 F (36.7 C) (Oral)   Resp 18   Ht 5' 3 (1.6 m)   Wt 59 kg   SpO2 100%   BMI 23.04 kg/m   Physical Exam Vitals and nursing note reviewed.   68 year old female, resting comfortably and in no acute distress. Vital signs are significant for borderline elevated blood pressure. Oxygen saturation is 100%, which is normal. Head is normocephalic and atraumatic. PERRLA, EOMI.  Neck is nontender. Back is nontender. Lungs are clear without rales, wheezes, or rhonchi. Chest is nontender. Heart has regular rate and rhythm without murmur. Abdomen is soft, flat, nontender. Extremities: There is minimal swelling noted of the right elbow, no deformity.  There is tenderness to to palpation rather diffusely throughout the right forearm with maximum tenderness over the right radial head.  There is marked pain with any passive range of motion of the right elbow.  Radial pulse is strong and capillary refill is prompt.  There is normal distal sensation.  Full range of motion present all other joints without pain. Skin is warm and dry without rash. Neurologic: Awake and alert.   Radiology: DG Forearm Right Result Date: 05/27/2024 EXAM: 3 VIEW(S) XRAY OF THE RIGHT FOREARM COMPARISON: None available. CLINICAL HISTORY: fall. Pt to ED from home with c/o right arm pain, pt trying to catch cat and fell, injuring right forearm/elbow area. No LOC, pt amublatory; Best images obtainable pt unable to turn arm. Hx fracture in R arm abt 10 yrs ago FINDINGS: BONES AND JOINTS: Comminuted radial head and coronoid process fractures. No dislocation. SOFT TISSUES: Elbow effusion. IMPRESSION: 1. Comminuted radial head and coronoid process fractures. No dislocation. 2. Elbow effusion. Electronically signed by: Dorethia Molt MD 05/27/2024 10:37 PM EDT  RP Workstation: [id number]     .Splint Application  Date/Time: 05/28/2024 12:18 AM  Performed by: Raford Lenis, MD Authorized by: Raford Lenis, MD   Consent:    Consent obtained:  Verbal   Consent given by:  Patient   Risks, benefits, and alternatives were discussed: yes     Risks discussed:  Numbness, pain and swelling   Alternatives discussed:  No treatment Universal protocol:    Procedure explained and questions answered to patient or proxy's satisfaction: yes     Relevant documents present and verified: yes     Test results available: yes      Imaging studies available: yes     Required blood products, implants, devices, and special equipment available: yes     Site/side marked: yes     Immediately prior to procedure a time out was called: yes     Patient identity confirmed:  Verbally with patient and arm band Pre-procedure details:    Distal neurologic exam:  Normal   Distal perfusion: distal pulses strong and brisk capillary refill   Procedure details:    Location:  Elbow   Elbow location:  R elbow   Strapping: no     Splint type:  Long arm   Supplies:  Elastic bandage and fiberglass Post-procedure details:    Distal neurologic exam:  Unchanged   Distal perfusion: unchanged     Procedure completion:  Tolerated well, no immediate complications   Post-procedure imaging: not applicable      Medications Ordered in the ED - No data to display                                  Medical Decision Making Amount and/or Complexity of Data Reviewed Radiology: ordered.  Risk Prescription drug management.   Fall with injury to right arm suspicious for radial head fracture.  X-rays of the right forearm show comminuted radial head and coronoid process fractures with an elbow effusion present.  Have independently viewed the images, and agree with the radiologist's interpretation.  I have applied a long-arm splint and I am referring her to orthopedics for follow-up.  I have advised her on ice, elevation, use over-the-counter NSAIDs and acetaminophen  as needed for pain and I am also discharging her with a take-home pack of oxycodone-acetaminophen  as needed for breakthrough pain.     Final diagnoses:  Closed nondisplaced fracture of head of right radius, initial encounter    ED Discharge Orders          Ordered    oxyCODONE-acetaminophen  (PERCOCET) 5-325 MG tablet  Every 4 hours PRN        05/28/24 0007               Raford Lenis, MD 05/28/24 [phone]

## 2024-05-27 NOTE — ED Triage Notes (Signed)
 Pt to ED from home with c/o right arm pain, pt trying to catch cat and fell, injuring right forearm/elbow area. No LOC, pt amublatory

## 2024-05-28 ENCOUNTER — Telehealth: Payer: Self-pay | Admitting: Orthopedic Surgery

## 2024-05-28 MED ORDER — OXYCODONE-ACETAMINOPHEN 5-325 MG PO TABS: 1.0000 | ORAL_TABLET | ORAL | 0 refills | Status: AC | PRN

## 2024-05-28 MED ORDER — IBUPROFEN 400 MG PO TABS
400.0000 mg | ORAL_TABLET | Freq: Once | ORAL | Status: AC
  Administered 2024-05-28: 400 mg via ORAL
  Filled 2024-05-28: qty 1

## 2024-05-28 MED ORDER — ACETAMINOPHEN 325 MG PO TABS
650.0000 mg | ORAL_TABLET | Freq: Once | ORAL | Status: AC
Start: 2024-05-28 — End: 2024-05-28
  Administered 2024-05-28: 650 mg via ORAL
  Filled 2024-05-28: qty 2

## 2024-05-28 NOTE — Discharge Instructions (Signed)
 Wear the splint until you see the orthopedic physician.  Apply ice for 30 minutes at a time, 4 times a day.  You may take acetaminophen  and/or ibuprofen  as needed for pain.  Please be aware that when you combine acetaminophen  and ibuprofen , you will get better pain relief and you get from taking other medication by itself.

## 2024-05-28 NOTE — Telephone Encounter (Signed)
 Pt lvm stating she was seen in the ED at [facility] 05/27/24 for a fx rt radius.  Where do you want me to add her?  [phone]

## 2024-05-28 NOTE — ED Notes (Signed)
 Assumed care of pt, she was rolled back to room in a wheelchair as she braced her right arm w/ a pillow.  Pt indicated that she was outside trying to get her cat and had a mechanical fall.  She did not hit her head, no LOC or thinners.  Pt is alert and oriented.

## 2024-05-29 ENCOUNTER — Other Ambulatory Visit: Payer: Self-pay

## 2024-05-29 ENCOUNTER — Ambulatory Visit: Admitting: Orthopedic Surgery

## 2024-05-29 VITALS — BP 150/85 | HR 67

## 2024-05-29 DIAGNOSIS — M25521 Pain in right elbow: Secondary | ICD-10-CM

## 2024-05-29 DIAGNOSIS — S52124A Nondisplaced fracture of head of right radius, initial encounter for closed fracture: Secondary | ICD-10-CM

## 2024-05-29 NOTE — Progress Notes (Signed)
 Office Visit Note   Patient: Valerie Parker           Date of Birth: August 13, 1956           MRN: [id number] Visit Date: 05/29/2024 Requested by: Edman Meade PEDLAR, FNP [address] [place],  [place] [address] PCP: Edman Meade PEDLAR, FNP   Assessment & Plan:   Encounter Diagnoses  Name Primary?   Pain in right elbow Yes   Closed nondisplaced fracture of head of right radius, initial encounter     No orders of the defined types were placed in this encounter.   68 year old female right radial head fracture nondisplaced  Recommend nonoperative treatment  Patient can start active flexion extension.  Avoid rotation  Continue sling  3 times a day come out of the sling for flexion extension exercises  X-ray in 2 weeks   Subjective: Chief Complaint  Patient presents with   Arm Injury    Right- it was dark and I was on unlevel ground I was chasing my cat and tripped and fell put my arm out to break the fall.    HPI: 68 year old female lost her balance trying to attend to her cat landed on her right upper extremity felt pain in the elbow.  In the ER radiographs showed a radial head fracture she was placed in a posterior splint and sling and comes in for evaluation and managemen           ROS: neg    Images personally read and my interpretation : The outside images were of the forearm and although the radial head fracture can be seen dedicated elbow fractures were ordered  Visit Diagnoses:  1. Pain in right elbow   2. Closed nondisplaced fracture of head of right radius, initial encounter      Follow-Up Instructions: Return in about 2 weeks (around 06/12/2024) for right radial head fracture/ elbow xrays .    Objective: Vital Signs: BP (!) 150/85   Pulse 67   Physical Exam Vitals and nursing note reviewed.  Constitutional:      Appearance: Normal appearance.  HENT:     Head: Normocephalic and atraumatic.  Eyes:     General: No scleral icterus.       Right eye:  No discharge.        Left eye: No discharge.     Extraocular Movements: Extraocular movements intact.     Conjunctiva/sclera: Conjunctivae normal.     Pupils: Pupils are equal, round, and reactive to light.  Cardiovascular:     Rate and Rhythm: Normal rate.     Pulses: Normal pulses.  Skin:    General: Skin is warm and dry.     Capillary Refill: Capillary refill takes less than 2 seconds.  Neurological:     General: No focal deficit present.     Mental Status: She is alert and oriented to person, place, and time.  Psychiatric:        Mood and Affect: Mood normal.        Behavior: Behavior normal.        Thought Content: Thought content normal.        Judgment: Judgment normal.      Right Elbow Exam   Tenderness  The patient is experiencing tenderness in the radial head.   Range of Motion  Extension:  abnormal  Flexion:  abnormal  Pronation:  abnormal  Supination:  abnormal   Other  Erythema: absent Scars: absent  Sensation: none Pulse: present  Comments:  No signs of radial nerve or posterior osseous nerve palsy         Specialty Comments:  No specialty comments available.  Imaging: No results found.   PMFS History: Patient Active Problem List   Diagnosis Date Noted   Right elbow pain 12/22/2023   Retinal tear of right eye 09/18/2023   Vitreous hemorrhage of right eye (HCC) 09/18/2023   Lower abdominal pain 10/13/2022   Pharyngitis 08/23/2022   Skin lesion 10/11/2021   Leg laceration, left, initial encounter 05/11/2021   Hammer toes, bilateral 10/20/2020   Chronic pain of left knee 10/20/2020   Vitamin D  deficiency 10/20/2020   Vaginal atrophy 05/27/2020   Encounter for general adult medical examination with abnormal findings 05/27/2020   Overweight (BMI 25.0-29.9) 12/19/2019   Impaired fasting glucose 12/19/2019   Overriding toe 12/19/2019   History of basal cell carcinoma of skin 12/19/2019   Osteopenia 04/28/2015   Onychomycosis 11/13/2012    HYPERCHOLESTEROLEMIA 04/15/2009   Essential hypertension 11/18/2008   Past Medical History:  Diagnosis Date   ABDOMINAL BLOATING 11/18/2008   Qualifier: Diagnosis of  By: Avelina MD, [name]     Basal cell carcinoma    on face   Hyperlipidemia    Hypertension    Lichen sclerosus et atrophicus 05/27/2020   Low back pain     Family History  Problem Relation Age of Onset   Cancer Mother 36       alcoholism; throat cancer   Diabetes Mother    Alcohol abuse Mother    COPD Father    Alcohol abuse Father    Heart disease Father    Hypertension Father    Diabetes Sister    Hepatitis C Brother    Hepatitis C Sister    Hepatitis C Sister    Mental illness Daughter    Colon cancer Neg Hx     Past Surgical History:  Procedure Laterality Date   CESAREAN SECTION     3 times   COLONOSCOPY  2010   repeat 3 years - polyps   TONSILLECTOMY     Social History   Occupational History   Occupation: CNA  Tobacco Use   Smoking status: Never   Smokeless tobacco: Never  Vaping Use   Vaping status: Never Used  Substance and Sexual Activity   Alcohol use: No    Alcohol/week: 0.0 standard drinks of alcohol   Drug use: No   Sexual activity: Not Currently    Birth control/protection: Post-menopausal    [phone]

## 2024-05-29 NOTE — Progress Notes (Signed)
  Intake history:  Chief Complaint  Patient presents with   Arm Injury    Right- it was dark and I was on unlevel ground I was chasing my cat and tripped and fell put my arm out to break the fall     There were no vitals taken for this visit. There is no height or weight on file to calculate BMI.  Pharmacy? _________[facility]_____________________________  WHAT ARE WE SEEING YOU FOR TODAY?   right arm(s)  How Valerie Parker has this bothered you? (DOI?DOS?WS?)  on 05/27/24  Was there an injury? Yes  Anticoag.  No  Diabetes No  Heart disease No  Hypertension Yes  SMOKING HX No  Kidney disease No  Any ALLERGIES ______________________________________________   Treatment:  Have you taken:  Tylenol  Yes  Advil  Yes  Had PT No  Had injection No  Other  _________________________

## 2024-05-30 ENCOUNTER — Telehealth: Payer: Self-pay

## 2024-05-30 NOTE — Telephone Encounter (Signed)
 Patient left a message stating that she needs a note explaining what she can do and can't do since she has a broken arm.  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   Please call and advise her at [phone]

## 2024-05-30 NOTE — Telephone Encounter (Signed)
 No use of right arm  05/30/24 until follow up on 06/17/24  Note printed / it printed at front desk, will you please put it in pick up folder she will come by to get it?

## 2024-06-10 ENCOUNTER — Ambulatory Visit: Payer: Medicare HMO

## 2024-06-10 VITALS — Ht 63.0 in | Wt 127.0 lb

## 2024-06-10 DIAGNOSIS — Z0000 Encounter for general adult medical examination without abnormal findings: Secondary | ICD-10-CM

## 2024-06-10 DIAGNOSIS — Z1231 Encounter for screening mammogram for malignant neoplasm of breast: Secondary | ICD-10-CM

## 2024-06-10 NOTE — Progress Notes (Signed)
 Please attest and cosign this visit due to patients primary care provider not being immediately available at the time the visit was completed.   Subjective:   Valerie Parker is a 68 y.o. who presents for a Medicare Wellness preventive visit. As a reminder, Annual Wellness Visits don't include a physical exam, and some assessments may be limited, especially if this visit is performed virtually. We may recommend an in-person follow-up visit with your provider if needed.  Visit Complete: Virtual I connected with  Valerie Parker on 06/10/24 by a video and audio enabled telemedicine application and verified that I am speaking with the correct person using two identifiers.  Patient Location: Home  Provider Location: Home Office  I discussed the limitations of evaluation and management by telemedicine. The patient expressed understanding and agreed to proceed.  Vital Signs: Because this visit was a virtual/telehealth visit, some criteria may be missing or patient reported. Any vitals not documented were not able to be obtained and vitals that have been documented are patient reported.  Persons Participating in Visit: Patient.  AWV Questionnaire: Yes: Patient Medicare AWV questionnaire was completed by the patient on 06/10/2024; I have confirmed that all information answered by patient is correct and no changes since this date. Cardiac Risk Factors include: advanced age (>58men, >2 women);dyslipidemia;hypertension     Objective:    Today's Vitals   06/10/24 0759 06/10/24 0815  Weight:  127 lb (57.6 kg)  Height:  5' 3 (1.6 m)  PainSc: 0-No pain 0-No pain   Body mass index is 22.5 kg/m.    06/10/2024    8:14 AM 05/27/2024   10:00 PM 06/07/2023    8:54 AM 01/20/2023   11:51 PM 05/17/2022   10:36 AM 04/24/2021    9:58 AM 04/22/2020   10:58 AM  Advanced Directives  Does Patient Have a Medical Advance Directive? No No No No No No No  Would patient like information on creating a medical  advance directive? No - Patient declined No - Patient declined No - Patient declined No - Patient declined  No - Patient declined No - Patient declined    Current Medications (verified) Outpatient Encounter Medications as of 06/10/2024  Medication Sig   amLODipine  (NORVASC ) 5 MG tablet Take 1 tablet (5 mg total) by mouth daily.   ezetimibe  (ZETIA ) 10 MG tablet Take 1 tablet by mouth once daily   lisinopril  (ZESTRIL ) 40 MG tablet Take 1 tablet (40 mg total) by mouth daily.   oxyCODONE-acetaminophen  (PERCOCET) 5-325 MG tablet Take 1 tablet by mouth every 4 (four) hours as needed for moderate pain (pain score 4-6).   [DISCONTINUED] Blood Pressure Monitoring (BLOOD PRESSURE CUFF) MISC 1 Act by Does not apply route daily.   [DISCONTINUED] Vitamin D , Ergocalciferol , (DRISDOL ) 1.25 MG (50000 UNIT) CAPS capsule Take 1 capsule (50,000 Units total) by mouth every 7 (seven) days.   No facility-administered encounter medications on file as of 06/10/2024.    Allergies (verified) Patient has no known allergies.   History: Past Medical History:  Diagnosis Date   ABDOMINAL BLOATING 11/18/2008   Qualifier: Diagnosis of  By: Avelina MD, [name]     Basal cell carcinoma    on face   Hyperlipidemia    Hypertension    Lichen sclerosus et atrophicus 05/27/2020   Low back pain    Past Surgical History:  Procedure Laterality Date   CESAREAN SECTION     3 times   COLONOSCOPY  2010   repeat 3  years - polyps   TONSILLECTOMY     Family History  Problem Relation Age of Onset   Cancer Mother 66       alcoholism; throat cancer   Diabetes Mother    Alcohol abuse Mother    COPD Father    Alcohol abuse Father    Heart disease Father    Hypertension Father    Diabetes Sister    Hepatitis C Brother    Hepatitis C Sister    Hepatitis C Sister    Mental illness Daughter    Colon cancer Neg Hx    Social History   Socioeconomic History   Marital status: Widowed    Spouse name: Not on file   Number of  children: 3   Years of education: Not on file   Highest education level: Some college, no degree  Occupational History   Occupation: CNA  Tobacco Use   Smoking status: Never   Smokeless tobacco: Never  Vaping Use   Vaping status: Never Used  Substance and Sexual Activity   Alcohol use: No    Alcohol/week: 0.0 standard drinks of alcohol   Drug use: No   Sexual activity: Not Currently    Birth control/protection: Post-menopausal  Other Topics Concern   Not on file  Social History Narrative   Lives alone now-husband passed in 2015- from heart condition    3 children-live close by; 4 granchildren   4 cats and 1 dog   Enjoys: out and about, grandchildren      Diet: eats all food groups   Caffeine: 1 pot of coffee daily   Water: 1-2 cups daily       Wears seat belt   Does not use phone while driving    Smoke detectors at home.   Social Drivers of Corporate investment banker Strain: Low Risk  (06/10/2024)   Overall Financial Resource Strain (CARDIA)    Difficulty of Paying Living Expenses: Not hard at all  Food Insecurity: No Food Insecurity (06/10/2024)   Hunger Vital Sign    Worried About Running Out of Food in the Last Year: Never true    Ran Out of Food in the Last Year: Never true  Transportation Needs: No Transportation Needs (06/10/2024)   PRAPARE - Administrator, Civil Service (Medical): No    Lack of Transportation (Non-Medical): No  Physical Activity: Sufficiently Active (06/10/2024)   Exercise Vital Sign    Days of Exercise per Week: 7 days    Minutes of Exercise per Session: 30 min  Stress: No Stress Concern Present (06/10/2024)   [employer] of Occupational Health - Occupational Stress Questionnaire    Feeling of Stress: Not at all  Social Connections: Moderately Isolated (06/10/2024)   Social Connection and Isolation Panel    Frequency of Communication with Friends and Family: More than three times a week    Frequency of Social  Gatherings with Friends and Family: More than three times a week    Attends Religious Services: More than 4 times per year    Active Member of [employer] or Organizations: No    Attends Banker Meetings: Never    Marital Status: Widowed    Tobacco Counseling Counseling given: Yes   Clinical Intake: Pre-visit preparation completed: Yes Pain : No/denies pain Pain Score: 0-No pain   BMI - recorded: 22.5 Nutritional Status: BMI of 19-24  Normal Nutritional Risks: None Diabetes: No Lab Results  Component Value Date  HGBA1C 5.4 04/18/2024   HGBA1C 5.3 12/22/2023   HGBA1C 5.7 (H) 08/21/2023    How often do you need to have someone help you when you read instructions, pamphlets, or other written materials from your doctor or pharmacy?: 1 - Never Interpreter Needed?: No Information entered by :: Kalianna Verbeke W CMA (AAMA)  Activities of Daily Living     06/10/2024    7:59 AM  In your present state of health, do you have any difficulty performing the following activities:  Hearing? 0  Vision? 0  Difficulty concentrating or making decisions? 0  Walking or climbing stairs? 0  Dressing or bathing? 0  Doing errands, shopping? 0  Preparing Food and eating ? N  Using the Toilet? N  In the past six months, have you accidently leaked urine? N  Do you have problems with loss of bowel control? N  Managing your Medications? N  Managing your Finances? N  Housekeeping or managing your Housekeeping? N   Patient Care Team: Bacchus, Meade PEDLAR, FNP as PCP - General (Family Medicine) Arthur [place]az[place]and Ueberroth as Consulting Physician (Ophthalmology) Caresse Cough, MD as Referring Physician (Ophthalmology) Margrette Taft BRAVO, MD as Consulting Physician (Orthopedic Surgery)  I have updated your Care Teams any recent Medical Services you may have received from other providers in the past year.     Assessment:   This is a routine wellness examination for Evgenia.  Hearing/Vision  screen Hearing Screening - Comments:: Patient complains of difficulty with hearing. ENT referral placed. Patient is in agreement with treatment plan. Aware that the office will call with an appointment.   Vision Screening - Comments:: Wears rx glasses - up to date with routine eye exams.  Cough Caresse, MD  Goals Addressed               This Visit's Progress     My goal is to retire. (pt-stated)   On track     Patient stated her goal is to retire        Depression Screen     06/10/2024    8:27 AM 04/18/2024    8:59 AM 12/22/2023   10:24 AM 08/21/2023   10:34 AM 06/07/2023    9:00 AM 01/19/2023    8:56 AM 10/13/2022   10:02 AM  PHQ 2/9 Scores  PHQ - 2 Score 0 0 0 0 0 0 0  PHQ- 9 Score 0 0 0 0 0 0 0     Fall Risk     06/10/2024    7:59 AM 04/18/2024    8:59 AM 12/22/2023   10:24 AM 06/07/2023    9:05 AM 04/21/2023   10:00 AM  Fall Risk   Falls in the past year? 1 0 0 0 0  Number falls in past yr: 0   0 0  Injury with Fall? 1   0 0  Risk for fall due to : History of fall(s);Impaired balance/gait;Impaired mobility;Orthopedic patient   No Fall Risks No Fall Risks  Follow up Falls evaluation completed;Education provided;Falls prevention discussed   Falls prevention discussed Falls evaluation completed    MEDICARE RISK AT HOME:  Medicare Risk at Home Any stairs in or around the home?: (Patient-Rptd) Yes If so, are there any without handrails?: (Patient-Rptd) No Home free of loose throw rugs in walkways, pet beds, electrical cords, etc?: (Patient-Rptd) Yes Adequate lighting in your home to reduce risk of falls?: (Patient-Rptd) Yes Life alert?: (Patient-Rptd) No Use of a cane, walker or w/c?: (Patient-Rptd)  No Grab bars in the bathroom?: (Patient-Rptd) No Shower chair or bench in shower?: (Patient-Rptd) No Elevated toilet seat or a handicapped toilet?: (Patient-Rptd) No  TIMED UP AND GO: Was the test performed?  No  Cognitive Function: 6CIT completed    04/24/2021    10:00 AM  MMSE - Mini Mental State Exam  Not completed: Unable to complete        06/10/2024    8:26 AM 06/07/2023    8:57 AM 05/17/2022   10:41 AM 04/24/2021   10:01 AM 04/22/2020   10:59 AM  6CIT Screen  What Year? 0 points 0 points 0 points 0 points 0 points  What month? 0 points 0 points 0 points 0 points 0 points  What time? 0 points 0 points 0 points 0 points 0 points  Count back from 20 0 points 0 points 0 points 0 points 0 points  Months in reverse 0 points 0 points 0 points 0 points 0 points  Repeat phrase 0 points 0 points 0 points 0 points 0 points  Total Score 0 points 0 points 0 points 0 points 0 points    Immunizations Immunization History  Administered Date(s) Administered   Influenza Whole 05/22/2008   Influenza,trivalent, recombinat, inj, PF 06/20/2022   Influenza-Unspecified 05/14/2018, 05/29/2023   PNEUMOCOCCAL CONJUGATE-20 10/13/2022   Td 08/23/2007   Tdap 05/11/2021   Zoster Recombinant(Shingrix) 04/19/2023   Zoster, Unspecified 05/29/2023    Screening Tests Health Maintenance  Topic Date Due   Mammogram  10/24/2023   Influenza Vaccine  03/22/2024   COVID-19 Vaccine (1 - 2025-26 season) Never done   DEXA SCAN  10/23/2024   Medicare Annual Wellness (AWV)  06/10/2025   Colonoscopy  09/08/2025   DTaP/Tdap/Td (3 - Td or Tdap) 05/12/2031   Pneumococcal Vaccine: 50+ Years  Completed   Hepatitis C Screening  Completed   Zoster Vaccines- Shingrix  Completed   Meningococcal B Vaccine  Aged Out    Health Maintenance Health Maintenance Due  Topic Date Due   Mammogram  10/24/2023   Influenza Vaccine  03/22/2024   COVID-19 Vaccine (1 - 2025-26 season) Never done   Health Maintenance Items Addressed: Mammogram ordered  Additional Screening: Vision Screening: Recommended annual ophthalmology exams for early detection of glaucoma and other disorders of the eye. Would you like a referral to an eye doctor? No    Dental Screening: Recommended annual  dental exams for proper oral hygiene  Community Resource Referral / Chronic Care Management: CRR required this visit?  No   CCM required this visit?  No  Plan:   I have personally reviewed and noted the following in the patient's chart:   Medical and social history Use of alcohol, tobacco or illicit drugs  Current medications and supplements including opioid prescriptions. Patient is currently taking opioid prescriptions. Information provided to patient regarding non-opioid alternatives. Patient advised to discuss non-opioid treatment plan with their provider. Functional ability and status Nutritional status Physical activity Advanced directives List of other physicians Hospitalizations, surgeries, and ER visits in previous 12 months Vitals Screenings to include cognitive, depression, and falls Referrals and appointments  In addition, I have reviewed and discussed with patient certain preventive protocols, quality metrics, and best practice recommendations. A written personalized care plan for preventive services as well as general preventive health recommendations were provided to patient.   Kloee Ballew, CMA   06/10/2024   After Visit Summary: (MyChart) Due to this being a telephonic visit, the after visit summary with patients  personalized plan was offered to patient via MyChart   Notes: Nothing significant to report at this time.

## 2024-06-10 NOTE — Patient Instructions (Signed)
 [name],  Thank you for taking the time for your Medicare Wellness Visit. I appreciate your continued commitment to your health goals. Please review the care plan we discussed, and feel free to reach out if I can assist you further.  Medicare recommends these wellness visits once per year to help you and your care team stay ahead of potential health issues. These visits are designed to focus on prevention, allowing your provider to concentrate on managing your acute and chronic conditions during your regular appointments.  Please note that Annual Wellness Visits do not include a physical exam. Some assessments may be limited, especially if the visit was conducted virtually. If needed, we may recommend a separate in-person follow-up with your provider.  Ongoing Care  Seeing your primary care provider every 3 to 6 months helps us  monitor your health and provide consistent, personalized care.   Referrals   Mammogram at [facility] [address] Call [phone] to schedule your screening No perfumes, lotions, or deodorants the day of your screening. You can schedule your mammogram through mychart!   Recommended Screenings:  Health Maintenance  Topic Date Due   Breast Cancer Screening  10/24/2023   Flu Shot  03/22/2024   COVID-19 Vaccine (1 - 2025-26 season) Never done   DEXA scan (bone density measurement)  10/23/2024   Medicare Annual Wellness Visit  06/10/2025   Colon Cancer Screening  09/08/2025   DTaP/Tdap/Td vaccine (3 - Td or Tdap) 05/12/2031   Pneumococcal Vaccine for age over 49  Completed   Hepatitis C Screening  Completed   Zoster (Shingles) Vaccine  Completed   Meningitis B Vaccine  Aged Out       06/10/2024    8:14 AM  Advanced Directives  Does Patient Have a Medical Advance Directive? No  Would patient like information on creating a medical advance directive? No - Patient declined    Advance Care Planning is important because it: Ensures you receive medical care that  aligns with your values, goals, and preferences. Provides guidance to your family and loved ones, reducing the emotional burden of decision-making during critical moments.  Vision: Annual vision screenings are recommended for early detection of glaucoma, cataracts, and diabetic retinopathy. These exams can also reveal signs of chronic conditions such as diabetes and high blood pressure.  Dental: Annual dental screenings help detect early signs of oral cancer, gum disease, and other conditions linked to overall health, including heart disease and diabetes.  Please see the attached documents for additional preventive care recommendations.

## 2024-06-13 DIAGNOSIS — S52126D Nondisplaced fracture of head of unspecified radius, subsequent encounter for closed fracture with routine healing: Secondary | ICD-10-CM | POA: Insufficient documentation

## 2024-06-17 ENCOUNTER — Other Ambulatory Visit: Payer: Self-pay

## 2024-06-17 ENCOUNTER — Encounter: Payer: Self-pay | Admitting: Orthopedic Surgery

## 2024-06-17 ENCOUNTER — Ambulatory Visit: Admitting: Orthopedic Surgery

## 2024-06-17 DIAGNOSIS — S52124D Nondisplaced fracture of head of right radius, subsequent encounter for closed fracture with routine healing: Secondary | ICD-10-CM

## 2024-06-17 NOTE — Progress Notes (Signed)
    06/17/2024   Chief Complaint  Patient presents with   Elbow Injury    Right     Encounter Diagnosis  Name Primary?   Closed nondisplaced fracture of head of right radius with routine healing, subsequent encounter 05/27/24 Yes    What pharmacy do you use ? _____[facility] ______________________  DOI/DOS/ Date: 05/27/24  Improved

## 2024-06-17 NOTE — Progress Notes (Signed)
 FRACTURE CARE FOLLOW UP   Patient: Valerie Parker           Date of Birth: 03/06/1956           MRN: [id number] Visit Date: 06/17/2024 Requested by: Edman Meade PEDLAR, FNP [address] [place],  [place] [address] PCP: Edman Meade PEDLAR, FNP  Encounter Diagnosis  Name Primary?   Closed nondisplaced fracture of head of right radius with routine healing, subsequent encounter 05/27/24 Yes    DOI: October 6  GLOBAL PERIOD: October 8 through January 8  Chief Complaint  Patient presents with   Elbow Injury    Right    Comminuted radial head fracture right elbow 3 weeks out  Patient has regained all pronation supination without pain flexion is 120 degrees extension is 35 degrees  X-ray listed below  DG Elbow 2 Views Right Result Date: 06/17/2024 X-ray report Chief complaint elbow fracture radial head Images AP and lateral right elbow Reading: Compared to x-ray taken on October 8 we see a radial neck fracture with the radial head component and a slightly displaced 2 part fracture which goes into the joint no evidence of elbow dislocation Impression: Radial head intra-articular fracture major fracture line creates a 2 part fracture there are some comminuted fragments of one of the parts of the fracture      TREATMENT PLAN   Active range of motion to improve flexion extension return in 3 weeks  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 extension physical therapy we will order at next visit

## 2024-06-20 ENCOUNTER — Encounter: Payer: Self-pay | Admitting: Orthopedic Surgery

## 2024-07-08 ENCOUNTER — Ambulatory Visit (INDEPENDENT_AMBULATORY_CARE_PROVIDER_SITE_OTHER): Admitting: Orthopedic Surgery

## 2024-07-08 DIAGNOSIS — S52124D Nondisplaced fracture of head of right radius, subsequent encounter for closed fracture with routine healing: Secondary | ICD-10-CM

## 2024-07-08 NOTE — Progress Notes (Signed)
 Fracture care   Encounter Diagnosis  Name Primary?   Closed nondisplaced fracture of head of right radius with routine healing, subsequent encounter 05/27/24 Yes   Chief Complaint  Patient presents with   Elbow Injury    Elbow fracture October 6 radial head    Valerie Parker is doing well she has a little extension deficit which we expected she is return to all activities of daily living light household work is normal grooming etc. normal as well  Exam shows full pronation supination as well as full of flexion with a slight deficit in extension  Expect no problems in the future  Return as needed

## 2024-07-23 ENCOUNTER — Other Ambulatory Visit: Payer: Self-pay | Admitting: Family Medicine

## 2024-07-23 DIAGNOSIS — I10 Essential (primary) hypertension: Secondary | ICD-10-CM

## 2024-09-19 ENCOUNTER — Telehealth: Admitting: Family Medicine

## 2024-09-19 DIAGNOSIS — R7301 Impaired fasting glucose: Secondary | ICD-10-CM

## 2024-09-19 DIAGNOSIS — E7849 Other hyperlipidemia: Secondary | ICD-10-CM

## 2024-09-19 DIAGNOSIS — E559 Vitamin D deficiency, unspecified: Secondary | ICD-10-CM

## 2024-09-19 DIAGNOSIS — E038 Other specified hypothyroidism: Secondary | ICD-10-CM

## 2024-09-19 DIAGNOSIS — I10 Essential (primary) hypertension: Secondary | ICD-10-CM | POA: Diagnosis not present

## 2024-09-19 NOTE — Assessment & Plan Note (Signed)
 Controlled Blood Pressure She is currently on amlodipine  5 mg and lisinopril  40 mg. She is asymptomatic at this time during the clinic visit. Encouraged to continue  her medication regimen, along with following a low-sodium diet and increasing physical activity.  The patient verbalized understanding of the plan of care and is aware of the recommendations.

## 2024-09-19 NOTE — Progress Notes (Signed)
" ° °  Virtual Visit via Video Note  I connected with Valerie Parker on 09/19/24 at  9:00 AM EST by a video enabled telemedicine application and verified that I am speaking with the correct person using two identifiers.  Patient Location: Home Provider Location: Home Office  I discussed the limitations, risks, security, and privacy concerns of performing an evaluation and management service by video and the availability of in person appointments. I also discussed with the patient that there may be a patient responsible charge related to this service. The patient expressed understanding and agreed to proceed.  Subjective: PCP: Edman Meade PEDLAR, FNP  Chief Complaint  Patient presents with   Medical Management of Chronic Issues    Five month follow up     HPI  The patient presents today for management of chronic conditions and reports no complaints or concerns at this time. She reports adherence to her treatment regimen with no side effects or adverse effects from her/his medications   ROS: Per HPI Current Medications[1]  Observations/Objective: There were no vitals filed for this visit. Physical Exam Patient is well-developed, well-nourished in no acute distress.  Resting comfortably at home.  Head is normocephalic, atraumatic.  No labored breathing.  Speech is clear and coherent with logical content.  Patient is alert and oriented at baseline.   Assessment and Plan: Essential hypertension Assessment & Plan: Controlled Blood Pressure She is currently on amlodipine  5 mg and lisinopril  40 mg. She is asymptomatic at this time during the clinic visit. Encouraged to continue  her medication regimen, along with following a low-sodium diet and increasing physical activity.  The patient verbalized understanding of the plan of care and is aware of the recommendations.        IFG (impaired fasting glucose) -     Hemoglobin A1c  Vitamin D  deficiency -     VITAMIN D  25 Hydroxy  (Vit-D Deficiency, Fractures)  TSH (thyroid -stimulating hormone deficiency) -     TSH + free T4  Other hyperlipidemia -     Lipid panel -     CMP14+EGFR -     CBC with Differential/Platelet   Encouraged the patient to continue her treatment regimen as prescribed. A heart-healthy diet was encouraged, along with increased physical activity as tolerated.  Follow Up Instructions: No follow-ups on file.   I discussed the assessment and treatment plan with the patient. The patient was provided an opportunity to ask questions, and all were answered. The patient agreed with the plan and demonstrated an understanding of the instructions.   The patient was advised to call back or seek an in-person evaluation if the symptoms worsen or if the condition fails to improve as anticipated.  The above assessment and management plan was discussed with the patient. The patient verbalized understanding of and has agreed to the management plan.   Meade PEDLAR Edman, FNP     [1]  Current Outpatient Medications:    amLODipine  (NORVASC ) 5 MG tablet, Take 1 tablet by mouth once daily, Disp: 90 tablet, Rfl: 0   ezetimibe  (ZETIA ) 10 MG tablet, Take 1 tablet by mouth once daily, Disp: 90 tablet, Rfl: 0   lisinopril  (ZESTRIL ) 40 MG tablet, Take 1 tablet by mouth once daily, Disp: 90 tablet, Rfl: 0   oxyCODONE -acetaminophen  (PERCOCET) 5-325 MG tablet, Take 1 tablet by mouth every 4 (four) hours as needed for moderate pain (pain score 4-6)., Disp: 6 tablet, Rfl: 0  "

## 2025-06-11 ENCOUNTER — Ambulatory Visit
